# Patient Record
Sex: Male | Born: 1960 | Race: White | Hispanic: No | Marital: Married | Smoking: Never smoker
Health system: Southern US, Community
[De-identification: ages and names within clinical notes are randomized; demographics above are authoritative.]

## PROBLEM LIST (undated history)

## (undated) DIAGNOSIS — E559 Vitamin D deficiency, unspecified: Secondary | ICD-10-CM

## (undated) DIAGNOSIS — F419 Anxiety disorder, unspecified: Secondary | ICD-10-CM

## (undated) DIAGNOSIS — R002 Palpitations: Principal | ICD-10-CM

## (undated) DIAGNOSIS — I10 Essential (primary) hypertension: Secondary | ICD-10-CM

## (undated) DIAGNOSIS — K219 Gastro-esophageal reflux disease without esophagitis: Secondary | ICD-10-CM

## (undated) DIAGNOSIS — I471 Supraventricular tachycardia: Secondary | ICD-10-CM

## (undated) DIAGNOSIS — I491 Atrial premature depolarization: Secondary | ICD-10-CM

## (undated) DIAGNOSIS — L570 Actinic keratosis: Secondary | ICD-10-CM

## (undated) HISTORY — DX: Gastro-esophageal reflux disease without esophagitis: K21.9

## (undated) HISTORY — DX: Essential (primary) hypertension: I10

## (undated) HISTORY — DX: Anxiety disorder, unspecified: F41.9

## (undated) HISTORY — DX: Actinic keratosis: L57.0

## (undated) HISTORY — DX: Atrial premature depolarization: I49.1

## (undated) HISTORY — PX: KNEE SURGERY: SHX244

## (undated) HISTORY — PX: REPLANTATION THUMB: SUR1233

## (undated) HISTORY — DX: Palpitations: R00.2

## (undated) HISTORY — DX: Supraventricular tachycardia: I47.1

## (undated) HISTORY — DX: Vitamin D deficiency, unspecified: E55.9

## (undated) HISTORY — PX: HERNIA REPAIR: SHX51

---

## 2004-08-16 ENCOUNTER — Emergency Department (HOSPITAL_COMMUNITY): Admission: EM | Admit: 2004-08-16 | Discharge: 2004-08-16 | Payer: Self-pay | Admitting: Emergency Medicine

## 2005-03-25 ENCOUNTER — Ambulatory Visit (HOSPITAL_COMMUNITY): Admission: RE | Admit: 2005-03-25 | Discharge: 2005-03-25 | Payer: Self-pay | Admitting: Orthopedic Surgery

## 2005-12-03 ENCOUNTER — Emergency Department (HOSPITAL_COMMUNITY): Admission: EM | Admit: 2005-12-03 | Discharge: 2005-12-03 | Payer: Self-pay | Admitting: Emergency Medicine

## 2005-12-26 ENCOUNTER — Ambulatory Visit: Payer: Self-pay | Admitting: Internal Medicine

## 2006-02-16 DIAGNOSIS — D229 Melanocytic nevi, unspecified: Secondary | ICD-10-CM

## 2006-02-16 DIAGNOSIS — C4491 Basal cell carcinoma of skin, unspecified: Secondary | ICD-10-CM

## 2006-02-16 HISTORY — DX: Basal cell carcinoma of skin, unspecified: C44.91

## 2006-02-16 HISTORY — DX: Melanocytic nevi, unspecified: D22.9

## 2009-04-27 ENCOUNTER — Ambulatory Visit (HOSPITAL_BASED_OUTPATIENT_CLINIC_OR_DEPARTMENT_OTHER): Admission: RE | Admit: 2009-04-27 | Discharge: 2009-04-27 | Payer: Self-pay | Admitting: General Surgery

## 2010-09-29 LAB — POCT HEMOGLOBIN-HEMACUE: Hemoglobin: 16.3 g/dL (ref 13.0–17.0)

## 2016-07-19 DIAGNOSIS — H9313 Tinnitus, bilateral: Secondary | ICD-10-CM | POA: Insufficient documentation

## 2016-07-19 DIAGNOSIS — H833X9 Noise effects on inner ear, unspecified ear: Secondary | ICD-10-CM | POA: Insufficient documentation

## 2017-07-18 ENCOUNTER — Ambulatory Visit (INDEPENDENT_AMBULATORY_CARE_PROVIDER_SITE_OTHER): Payer: Self-pay | Admitting: Orthopaedic Surgery

## 2017-07-19 ENCOUNTER — Ambulatory Visit (INDEPENDENT_AMBULATORY_CARE_PROVIDER_SITE_OTHER): Payer: Medicare Other

## 2017-07-19 ENCOUNTER — Ambulatory Visit (INDEPENDENT_AMBULATORY_CARE_PROVIDER_SITE_OTHER): Payer: Medicare Other | Admitting: Physician Assistant

## 2017-07-19 ENCOUNTER — Encounter (INDEPENDENT_AMBULATORY_CARE_PROVIDER_SITE_OTHER): Payer: Self-pay | Admitting: Physician Assistant

## 2017-07-19 DIAGNOSIS — M79672 Pain in left foot: Secondary | ICD-10-CM | POA: Diagnosis not present

## 2017-07-19 MED ORDER — METHYLPREDNISOLONE 4 MG PO TABS: ORAL_TABLET | ORAL | 0 refills | Status: DC

## 2017-07-19 NOTE — Progress Notes (Deleted)
xr

## 2017-07-19 NOTE — Progress Notes (Signed)
Office Visit Note   Patient: Jason Rubio           Date of Birth: [birth year]           MRN: [id number] Visit Date: 07/19/2017              Requested by: Deland Pretty, MD [address] [place], [place] [address] PCP: Deland Pretty, MD   Assessment & Plan: Visit Diagnoses:  1. Pain in left foot     Plan:  We will place him on a Medrol Dosepak.  Seen in physical therapy for eccentric exercises of modalities to the left peroneal brevis tendon insertion.  He will follow-up with [place] in 4 weeks unless he is doing well he can cancel the appointment.  Questions were encouraged and answered at length today.  He is to take no NSAIDs while on Medrol Dosepak.  Follow-Up Instructions: Return in about 4 weeks (around 08/16/2017).   Orders:  Orders Placed This Encounter  Procedures  . XR Foot Complete Left   Meds ordered this encounter  Medications  . methylPREDNISolone (MEDROL) 4 MG tablet    Sig: Take as directed    Dispense:  21 tablet    Refill:  0      Procedures: No procedures performed   Clinical Data: No additional findings.   Subjective: Chief Complaint  Patient presents with  . Left Foot - Pain    HPI Jason Rubio is a 57 year old male comes in today for left foot pain.  States he had left hip pain 2 weeks ago went to his primary care provider at [facility] and radiographs were obtained this did not show any kind of fracture.  He was placed on meloxicam.  He did improve until yesterday he went and was standing on a step stool running for some time and his foot pain began to bother him again.  He has had no injury at all to the foot.  He denies any swelling.  Pain is mostly lateral aspect of the foot.  Pain is now a 3 out of 10 at worst.  It was 10 out of 10 2 weeks ago. Review of Systems No fevers chills shortness of breath chest pain  Objective: Vital Signs: There were no vitals taken for this visit.  Physical Exam  Constitutional: He is  oriented to person, place, and time. He appears well-developed and well-nourished. No distress.  Cardiovascular: Intact distal pulses.  Neurological: He is alert and oriented to person, place, and time.  Skin: He is not diaphoretic.  Psychiatric: He has a normal mood and affect.    Ortho Exam Bilateral feet no rashes skin lesions ulcerations erythema.  No tenderness over the posterior tibial tendons bilaterally.  Tenderness over the left peroneal brevis insertion insertion region.  Has no loss of strength with  eversion of the left foot against resistance.  He has some discomfort at the peroneus brevis insertion with extreme passive inversion of the left foot.  Nontender bilateral feet over the Achilles plantar fascia medial tubercle of the calcaneus. Specialty Comments:  No specialty comments available.  Imaging: Xr Foot Complete Left  Result Date: 07/19/2017  Left foot 3 view:  Calcaneal heel spur seen.  Otherwise no bony abnormalities: No acute fracture.  No significant arthritic changes throughout the foot.    PMFS History: There are no active problems to display for this patient.  History reviewed. No pertinent past medical history.  History reviewed. No pertinent family history.  History reviewed. No pertinent surgical history. Social History   Occupational History  . Not on file  Tobacco Use  . Smoking status: Not on file  Substance and Sexual Activity  . Alcohol use: Not on file  . Drug use: Not on file  . Sexual activity: Not on file

## 2017-08-16 ENCOUNTER — Ambulatory Visit (INDEPENDENT_AMBULATORY_CARE_PROVIDER_SITE_OTHER): Payer: Medicare Other | Admitting: Physician Assistant

## 2017-12-11 DIAGNOSIS — Z86010 Personal history of colonic polyps: Secondary | ICD-10-CM | POA: Diagnosis not present

## 2018-02-14 DIAGNOSIS — D229 Melanocytic nevi, unspecified: Secondary | ICD-10-CM | POA: Diagnosis not present

## 2018-02-14 DIAGNOSIS — L821 Other seborrheic keratosis: Secondary | ICD-10-CM | POA: Diagnosis not present

## 2018-02-14 DIAGNOSIS — L918 Other hypertrophic disorders of the skin: Secondary | ICD-10-CM | POA: Diagnosis not present

## 2018-03-05 DIAGNOSIS — Z23 Encounter for immunization: Secondary | ICD-10-CM | POA: Diagnosis not present

## 2018-04-03 DIAGNOSIS — S0096XA Insect bite (nonvenomous) of unspecified part of head, initial encounter: Secondary | ICD-10-CM | POA: Diagnosis not present

## 2018-04-03 DIAGNOSIS — W57XXXA Bitten or stung by nonvenomous insect and other nonvenomous arthropods, initial encounter: Secondary | ICD-10-CM | POA: Diagnosis not present

## 2018-04-03 DIAGNOSIS — L03221 Cellulitis of neck: Secondary | ICD-10-CM | POA: Diagnosis not present

## 2018-04-06 DIAGNOSIS — W57XXXD Bitten or stung by nonvenomous insect and other nonvenomous arthropods, subsequent encounter: Secondary | ICD-10-CM | POA: Diagnosis not present

## 2018-04-06 DIAGNOSIS — S0096XD Insect bite (nonvenomous) of unspecified part of head, subsequent encounter: Secondary | ICD-10-CM | POA: Diagnosis not present

## 2018-04-11 DIAGNOSIS — R21 Rash and other nonspecific skin eruption: Secondary | ICD-10-CM | POA: Diagnosis not present

## 2018-06-26 DIAGNOSIS — I10 Essential (primary) hypertension: Secondary | ICD-10-CM | POA: Diagnosis not present

## 2018-06-26 DIAGNOSIS — R002 Palpitations: Secondary | ICD-10-CM | POA: Diagnosis not present

## 2018-07-13 DIAGNOSIS — R002 Palpitations: Secondary | ICD-10-CM | POA: Diagnosis not present

## 2018-07-20 DIAGNOSIS — Z0000 Encounter for general adult medical examination without abnormal findings: Secondary | ICD-10-CM | POA: Diagnosis not present

## 2018-07-20 DIAGNOSIS — I10 Essential (primary) hypertension: Secondary | ICD-10-CM | POA: Diagnosis not present

## 2018-07-20 DIAGNOSIS — E559 Vitamin D deficiency, unspecified: Secondary | ICD-10-CM | POA: Diagnosis not present

## 2018-07-26 DIAGNOSIS — R002 Palpitations: Secondary | ICD-10-CM | POA: Diagnosis not present

## 2018-07-27 DIAGNOSIS — I471 Supraventricular tachycardia: Secondary | ICD-10-CM | POA: Diagnosis not present

## 2018-07-27 DIAGNOSIS — Z0000 Encounter for general adult medical examination without abnormal findings: Secondary | ICD-10-CM | POA: Diagnosis not present

## 2018-08-22 DIAGNOSIS — K644 Residual hemorrhoidal skin tags: Secondary | ICD-10-CM | POA: Diagnosis not present

## 2018-08-22 DIAGNOSIS — K602 Anal fissure, unspecified: Secondary | ICD-10-CM | POA: Diagnosis not present

## 2018-08-28 ENCOUNTER — Encounter: Payer: Self-pay | Admitting: Cardiology

## 2018-08-28 DIAGNOSIS — R002 Palpitations: Secondary | ICD-10-CM | POA: Insufficient documentation

## 2018-08-28 DIAGNOSIS — I471 Supraventricular tachycardia, unspecified: Secondary | ICD-10-CM

## 2018-08-28 DIAGNOSIS — I491 Atrial premature depolarization: Secondary | ICD-10-CM

## 2018-08-28 HISTORY — DX: Palpitations: R00.2

## 2018-08-28 HISTORY — DX: Supraventricular tachycardia: I47.1

## 2018-08-28 HISTORY — DX: Supraventricular tachycardia, unspecified: I47.10

## 2018-08-28 HISTORY — DX: Atrial premature depolarization: I49.1

## 2018-08-28 NOTE — Progress Notes (Signed)
Subjective:  Primary Physician:  Deland Pretty, MD  Patient ID: Jason Rubio, male    DOB: 1960-08-31, 58 y.o.   MRN: [id number]  Chief Complaint  Patient presents with  . Palpitations    pt wore monitor in feb 2020    HPI: Jason Rubio  is a 58 y.o. male  with hypertension, referred to [place] for evaluation of palpitations and possible SVT.  Patient reports that he has had palpitations for awhile; however, have recently gotten more frequent. He describes palpitations as a fluttering. He reports one episode last year while driving that lasted a few minutes and felt slightly light headed. No associated chest pain or syncope. Mild shortness of breath.  No history of hyperlipidemia, diabetes, or thyroid disorders. No history of sleep apnea. Denies any snoring.  He is fairly active with doing things around his house and taking care of grandchildren. No former tobacco use. He does occasionally drink alcohol. No illicit drug use. Minimal caffeine use.  Past Medical History:  Diagnosis Date  . Anxiety   . Essential hypertension 08/29/2018  . GERD (gastroesophageal reflux disease)   . PAC (premature atrial contraction) 08/28/2018  . Palpitations 08/28/2018  . SVT (supraventricular tachycardia) ([place])   . Vitamin D deficiency     Past Surgical History:  Procedure Laterality Date  . HERNIA REPAIR    . KNEE SURGERY    . REPLANTATION THUMB      Social History   Socioeconomic History  . Marital status: Married    Spouse name: Not on file  . Number of children: Not on file  . Years of education: Not on file  . Highest education level: Not on file  Occupational History  . Not on file  Social Needs  . Financial resource strain: Not on file  . Food insecurity:    Worry: Not on file    Inability: Not on file  . Transportation needs:    Medical: Not on file    Non-medical: Not on file  Tobacco Use  . Smoking status: Never Smoker  . Smokeless tobacco: Never Used  Substance and  Sexual Activity  . Alcohol use: Yes  . Drug use: Not on file  . Sexual activity: Not on file  Lifestyle  . Physical activity:    Days per week: Not on file    Minutes per session: Not on file  . Stress: Not on file  Relationships  . Social connections:    Talks on phone: Not on file    Gets together: Not on file    Attends religious service: Not on file    Active member of club or organization: Not on file    Attends meetings of clubs or organizations: Not on file    Relationship status: Not on file  . Intimate partner violence:    Fear of current or ex partner: Not on file    Emotionally abused: Not on file    Physically abused: Not on file    Forced sexual activity: Not on file  Other Topics Concern  . Not on file  Social History Narrative  . Not on file    Current Outpatient Medications on File Prior to Visit  Medication Sig Dispense Refill  . ALPRAZolam (XANAX) 0.5 MG tablet TAKE 1/2 OR 1 TABLET BY MOUTH 3 TIMES A DAY AS NEEDED  0  . losartan (COZAAR) 100 MG tablet TAKE 0.5 OF A TABLET ONCE DAILY  1  . Multiple Vitamin  tablet Take 1 tablet by mouth daily.     No current facility-administered medications on file prior to visit.      Review of Systems  Constitutional: Negative for fever, malaise/fatigue and weight loss.  Eyes: Negative for blurred vision.  Respiratory: Negative for cough and shortness of breath.   Cardiovascular: Positive for palpitations. Negative for chest pain, claudication and leg swelling.  Gastrointestinal: Negative for nausea and vomiting.  Musculoskeletal: Negative for myalgias.  Neurological: Negative for dizziness, focal weakness and headaches.  Endo/Heme/Allergies: Does not bruise/bleed easily.       Objective:  Blood pressure (!) 149/83, pulse 70, height 5' 9.25" (1.759 m), weight 204 lb (92.5 kg), SpO2 97 %. Body mass index is 29.91 kg/m.  Physical Exam  Constitutional: He is oriented to person, place, and time. Vital signs are  normal. He appears well-developed and well-nourished.  HENT:  Head: Normocephalic and atraumatic.  Neck: Normal range of motion.  Cardiovascular: Normal rate, regular rhythm, normal heart sounds and intact distal pulses.  Pulmonary/Chest: Effort normal and breath sounds normal. No accessory muscle usage. No respiratory distress.  Abdominal: Soft. Bowel sounds are normal.  Musculoskeletal: Normal range of motion.  Neurological: He is alert and oriented to person, place, and time.  Skin: Skin is warm and dry.  Vitals reviewed.   CARDIAC STUDIES:  Event monitor 2 weeks 06/26/2018: Isolated supraventricular ectopics and ventricular ectopics.  3 atrial tachycardia episodes, longest 10 beats.  Both occurred after 10:30 PM.  Assessment & Recommendations:   1. Palpitations Noted to have occasional PAC's and brief atrial tachycardia on event monitor. No SVT. Minimally symptomatic. Patient states he is not bothered by them at this point; therefore, changes were not made. 2. PACs Event monitor 2 weeks 06/26/2018: Isolated supraventricular ectopics and ventricular ectopics.  3 atrial tachycardia episodes, longest 10 beats.  Both occurred after 10:30 PM. Etiology of this was discussed. He has minimal risk factors. Do not feel that he needs further cardiac workup at this point, but encouraged him to contact me should symptoms worsen.   3. Essential hypertension Generally well controlled and monitored at home. Slightly elevated in our office. He will continue to monitor and follow up with PCP if elevated consistently.  4. Laboratory Exam: Labs 07/20/2018: CBC normal, potassium 4.8, serum glucose 87 mg, BUN 12, creatinine 1.18, CMP normal.  Total cholesterol 180, triglycerides 170, HDL 61, LDL 85.   Recommendation: As patient has minimal symptoms and minimal risk factors, do not feel further evaluation is warranted. I have reassured him. Encouraged him to contact us for any worsening symptoms.     *I have discussed this case with Dr. Einar Gip and he personally examined the patient and participated in formulating the plan.Jeri Lager, FNP-C 08/29/2018, 10:21 AM [facility]. [place] Office: [phone]

## 2018-08-29 ENCOUNTER — Encounter: Payer: Self-pay | Admitting: Cardiology

## 2018-08-29 ENCOUNTER — Ambulatory Visit: Payer: Medicare Other | Admitting: Cardiology

## 2018-08-29 DIAGNOSIS — I491 Atrial premature depolarization: Secondary | ICD-10-CM | POA: Diagnosis not present

## 2018-08-29 DIAGNOSIS — I471 Supraventricular tachycardia, unspecified: Secondary | ICD-10-CM

## 2018-08-29 DIAGNOSIS — R002 Palpitations: Secondary | ICD-10-CM | POA: Diagnosis not present

## 2018-08-29 DIAGNOSIS — I10 Essential (primary) hypertension: Secondary | ICD-10-CM | POA: Diagnosis not present

## 2018-08-29 HISTORY — DX: Essential (primary) hypertension: I10

## 2018-09-24 DIAGNOSIS — K602 Anal fissure, unspecified: Secondary | ICD-10-CM | POA: Diagnosis not present

## 2018-09-24 DIAGNOSIS — K644 Residual hemorrhoidal skin tags: Secondary | ICD-10-CM | POA: Diagnosis not present

## 2019-03-11 DIAGNOSIS — Z23 Encounter for immunization: Secondary | ICD-10-CM | POA: Diagnosis not present

## 2019-04-12 DIAGNOSIS — K648 Other hemorrhoids: Secondary | ICD-10-CM | POA: Diagnosis not present

## 2019-04-12 DIAGNOSIS — Z86010 Personal history of colonic polyps: Secondary | ICD-10-CM | POA: Diagnosis not present

## 2019-04-12 DIAGNOSIS — K644 Residual hemorrhoidal skin tags: Secondary | ICD-10-CM | POA: Diagnosis not present

## 2019-07-25 DIAGNOSIS — I10 Essential (primary) hypertension: Secondary | ICD-10-CM | POA: Diagnosis not present

## 2019-07-25 DIAGNOSIS — Z0000 Encounter for general adult medical examination without abnormal findings: Secondary | ICD-10-CM | POA: Diagnosis not present

## 2019-07-25 DIAGNOSIS — E559 Vitamin D deficiency, unspecified: Secondary | ICD-10-CM | POA: Diagnosis not present

## 2019-07-31 DIAGNOSIS — I10 Essential (primary) hypertension: Secondary | ICD-10-CM | POA: Diagnosis not present

## 2019-07-31 DIAGNOSIS — Z0000 Encounter for general adult medical examination without abnormal findings: Secondary | ICD-10-CM | POA: Diagnosis not present

## 2019-11-04 DIAGNOSIS — R002 Palpitations: Secondary | ICD-10-CM | POA: Diagnosis not present

## 2019-11-04 DIAGNOSIS — M545 Low back pain: Secondary | ICD-10-CM | POA: Diagnosis not present

## 2019-11-27 NOTE — Progress Notes (Signed)
Date:  11/28/2019   ID:  Jason Rubio, DOB 04/29/1961, MRN [id number]  PCP:  Deland Pretty, MD  Cardiologist:  Rex Kras, DO, [facility] Former Cardiology Providers: Jeri Lager, APRN, FNP-C  REQUESTING PHYSICIAN:  Deland Pretty, MD [address] [place],   [address]  Chief Complaint  Patient presents with  . Palpitations    last seen 08/27/2018    HPI  Jason Rubio is a 59 y.o. male who presents to the office with chief complaint of palpitations.  His past medical history and cardiovascular risk factors include hypertension and premature atrial contractions.  Palpitations: Patient was last seen in the office back in March 2020 for symptoms of palpitations and after undergoing an event monitor.  Since then he was doing well and recently had an episode of palpitations.  Patient states that she usually gets palpitations when he is stressed.  Recently he has been stressed due to some house work that needs to be completed and he is having a generator placed as well.  His episodes of palpitations are intermittent, can last for minutes up to an hour, not associated with symptoms of dizziness, lightheadedness, near-syncope or syncope.  But he is concerned enough to mention it to his primary care provider who referred him to [place] for further evaluation.    Based on prior records he had a cardiac event monitor for 2 weeks back in 2019 which showed isolated supraventricular and ventricular ectopic beats, 3 episodes of atrial tachycardia per report.  At the time of follow-up patient was asymptomatic and no additional cardiac work-up was recommended and no medications were started.  No former tobacco use.  He drinks approximately 3 to 4 cans of beer in one sitting at least 4 times a week, no energy drink consumption, consumes 2 cups of coffee on a daily basis, no recreational drugs use.  Denies prior history of coronary artery disease, myocardial infarction, congestive heart failure,  deep venous thrombosis, pulmonary embolism, stroke, transient ischemic attack.  FUNCTIONAL STATUS: No structured exercise program or daily routine. But maintains a home and looks after grandchildren.    ALLERGIES: Allergies  Allergen Reactions  . Latex Other (See Comments)    Skin burns  . Demerol [Meperidine] Nausea Only    MEDICATION LIST PRIOR TO VISIT: Current Meds  Medication Sig  . ALPRAZolam (XANAX) 0.5 MG tablet TAKE 1/2 OR 1 TABLET BY MOUTH 3 TIMES A DAY AS NEEDED  . cholecalciferol (VITAMIN D3) 25 MCG (1000 UNIT) tablet Take 1,000 Units by mouth daily.  Marland Kitchen esomeprazole (NEXIUM) 20 MG capsule Take 20 mg by mouth daily at 12 noon.  Marland Kitchen losartan (COZAAR) 50 MG tablet Take by mouth.  . Multiple Vitamin tablet Take 1 tablet by mouth daily.  . vitamin E 1000 UNIT capsule Take 1,000 Units by mouth daily.     PAST MEDICAL HISTORY: Past Medical History:  Diagnosis Date  . Anxiety   . Essential hypertension 08/29/2018  . GERD (gastroesophageal reflux disease)   . PAC (premature atrial contraction) 08/28/2018  . Palpitations 08/28/2018  . SVT (supraventricular tachycardia) ([place])   . Vitamin D deficiency     PAST SURGICAL HISTORY: Past Surgical History:  Procedure Laterality Date  . HERNIA REPAIR    . KNEE SURGERY    . REPLANTATION THUMB      FAMILY HISTORY: The patient family history includes Cancer in his brother; Depression in his mother; Heart defect in his brother; Heart disease in his father.  SOCIAL HISTORY:  The patient  reports that he has never smoked. He has never used smokeless tobacco. He reports current alcohol use.  REVIEW OF SYSTEMS: Review of Systems  Constitution: Negative for chills and fever.  HENT: Negative for hoarse voice and nosebleeds.   Eyes: Negative for discharge, double vision and pain.  Cardiovascular: Positive for palpitations. Negative for chest pain, claudication, dyspnea on exertion, leg swelling, near-syncope, orthopnea, paroxysmal  nocturnal dyspnea and syncope.  Respiratory: Negative for hemoptysis and shortness of breath.   Musculoskeletal: Negative for muscle cramps and myalgias.  Gastrointestinal: Negative for abdominal pain, constipation, diarrhea, hematemesis, hematochezia, melena, nausea and vomiting.  Neurological: Negative for dizziness and light-headedness.    PHYSICAL EXAM: Vitals with BMI 11/28/2019 08/29/2018  Height 5' 10"  5' 9.25"  Weight 190 lbs 204 lbs  BMI 19.50 93.26  Systolic 712 458  Diastolic 86 83  Pulse 71 70   CONSTITUTIONAL: Well-developed and well-nourished. No acute distress.  SKIN: Skin is warm and dry. No rash noted. No cyanosis. No pallor. No jaundice.  Multiple tattoos noted in bilateral upper and lower extremities. HEAD: Normocephalic and atraumatic.  EYES: No scleral icterus MOUTH/THROAT: Moist oral membranes.  NECK: No JVD present. No thyromegaly noted. No carotid bruits  LYMPHATIC: No visible cervical adenopathy.  CHEST Normal respiratory effort. No intercostal retractions  LUNGS: Clear to auscultation bilaterally.  No stridor. No wheezes. No rales.  CARDIOVASCULAR: Regular rate and rhythm, positive S1-S2, no murmurs rubs or gallops appreciated ABDOMINAL: soft, nontender, nondistended, positive bowel sounds in all no apparent ascites.  EXTREMITIES: No peripheral edema  HEMATOLOGIC: No significant bruising NEUROLOGIC: Oriented to person, place, and time. Nonfocal. Normal muscle tone.  PSYCHIATRIC: Normal mood and affect. Normal behavior. Cooperative  CARDIAC DATABASE: EKG: 11/28/2019: Normal sinus rhythm, 72 bpm, normal axis, no underlying ischemia or injury pattern.  Echocardiogram: None  Stress Testing: None   Heart Catheterization: None  Event monitor: 06/26/2018: Isolated supraventricular ectopics and ventricular ectopics.  3 atrial tachycardia episodes, longest 10 beats.  Both occurred after 10:30 PM.  LABORATORY DATA:  External Labs: Collected:  11/04/2019 Creatinine 0.99 mg/dL. EGFR:>60 mL/min per 1.73 m Lipid profile: Total cholesterol 224, triglycerides 306, HDL 61, LDL 111  07/20/2018: CBC normal, potassium 4.8, serum glucose 87 mg, BUN 12, creatinine 1.18, CMP normal.  Total cholesterol 180, triglycerides 170, HDL 61, LDL 85.  IMPRESSION:    ICD-10-CM   1. Palpitations  R00.2 EKG 12-Lead    EXTERNAL ECG MONITOR (48HRS-7DAYS) REVIEW AND INTERPRETATION    PCV ECHOCARDIOGRAM COMPLETE    PCV CARDIAC STRESS TEST    TSH  2. Essential hypertension  I10   3. PAC (premature atrial contraction)  I49.1      RECOMMENDATIONS: Jason Rubio is a 59 y.o. male whose past medical history and cardiac risk factors include: Benign essential hypertension and premature atrial contractions.  Palpitations: Patient had a cardiac event monitor back in 2019.  The event monitor as per report did not have any significant ventricular ectopic burden, no mention of atrial fibrillation or flutter, and no pauses.  Patient did have episodes of PAC and atrial tachycardia.  At that time no pharmacological therapy was initiated as patient was asymptomatic at the time of follow-up. As the symptoms do resurface episodically mostly during stressful situations pretest probability of any significant arrhythmia may be low.  However because the symptoms are recurrent reevaluation is recommended. Echocardiogram will be ordered to evaluate for structural heart disease and left ventricular systolic function. Plan  exercise treadmill stress test to evaluate for exercise-induced ischemia. Check TSH. 7-day Holter monitor to evaluate for underlying arrhythmic burden.  Benign essential hypertension: Currently managed by primary team.  Most recent blood work from his PCPs office reviewed independently.  Patient's triglyceride levels are not well controlled patient is informed that this could be secondary to high carbohydrate intake and or alcohol use.  He will follow-up  with his primary care provider for further evaluation and management.  FINAL MEDICATION LIST END OF ENCOUNTER: No orders of the defined types were placed in this encounter.    Current Outpatient Medications:  .  ALPRAZolam (XANAX) 0.5 MG tablet, TAKE 1/2 OR 1 TABLET BY MOUTH 3 TIMES A DAY AS NEEDED, Disp: , Rfl: 0 .  cholecalciferol (VITAMIN D3) 25 MCG (1000 UNIT) tablet, Take 1,000 Units by mouth daily., Disp: , Rfl:  .  esomeprazole (NEXIUM) 20 MG capsule, Take 20 mg by mouth daily at 12 noon., Disp: , Rfl:  .  losartan (COZAAR) 50 MG tablet, Take by mouth., Disp: , Rfl:  .  Multiple Vitamin tablet, Take 1 tablet by mouth daily., Disp: , Rfl:  .  vitamin E 1000 UNIT capsule, Take 1,000 Units by mouth daily., Disp: , Rfl:   Orders Placed This Encounter  Procedures  . TSH  . EXTERNAL ECG MONITOR (48HRS-7DAYS) REVIEW AND INTERPRETATION  . PCV CARDIAC STRESS TEST  . EKG 12-Lead  . PCV ECHOCARDIOGRAM COMPLETE    There are no Patient Instructions on file for this visit.   --Continue cardiac medications as reconciled in final medication list. --Return in about 6 weeks (around 01/09/2020) for review test results., re-evaluation of symptoms.. Or sooner if needed. --Continue follow-up with your primary care physician regarding the management of your other chronic comorbid conditions.  Patient's questions and concerns were addressed to his satisfaction. He voices understanding of the instructions provided during this encounter.   This note was created using a voice recognition software as a result there may be grammatical errors inadvertently enclosed that do not reflect the nature of this encounter. Every attempt is made to correct such errors.  Rex Kras, [place], [facility]  Pager: [phone] Office: [phone]

## 2019-11-28 ENCOUNTER — Other Ambulatory Visit: Payer: Self-pay

## 2019-11-28 ENCOUNTER — Encounter: Payer: Self-pay | Admitting: Cardiology

## 2019-11-28 ENCOUNTER — Ambulatory Visit: Payer: Medicare Other | Admitting: Cardiology

## 2019-11-28 VITALS — BP 138/86 | HR 71 | Ht 70.0 in | Wt 190.0 lb

## 2019-11-28 DIAGNOSIS — I10 Essential (primary) hypertension: Secondary | ICD-10-CM

## 2019-11-28 DIAGNOSIS — I491 Atrial premature depolarization: Secondary | ICD-10-CM | POA: Diagnosis not present

## 2019-11-28 DIAGNOSIS — R002 Palpitations: Secondary | ICD-10-CM

## 2019-12-02 DIAGNOSIS — R002 Palpitations: Secondary | ICD-10-CM | POA: Diagnosis not present

## 2019-12-03 LAB — TSH: TSH: 4.79 u[IU]/mL — ABNORMAL HIGH (ref 0.450–4.500)

## 2019-12-04 ENCOUNTER — Other Ambulatory Visit: Payer: Medicare Other

## 2019-12-16 ENCOUNTER — Other Ambulatory Visit: Payer: Self-pay

## 2019-12-16 ENCOUNTER — Encounter: Payer: Self-pay | Admitting: *Deleted

## 2019-12-16 ENCOUNTER — Ambulatory Visit: Payer: Medicare Other

## 2019-12-16 DIAGNOSIS — R002 Palpitations: Secondary | ICD-10-CM

## 2019-12-23 NOTE — Progress Notes (Signed)
Called patient, NA, LMAM

## 2019-12-24 NOTE — Progress Notes (Signed)
Spoke with patient voiced understanding

## 2019-12-26 DIAGNOSIS — R0789 Other chest pain: Secondary | ICD-10-CM | POA: Diagnosis not present

## 2019-12-26 DIAGNOSIS — R21 Rash and other nonspecific skin eruption: Secondary | ICD-10-CM | POA: Diagnosis not present

## 2019-12-26 DIAGNOSIS — R0781 Pleurodynia: Secondary | ICD-10-CM | POA: Diagnosis not present

## 2019-12-26 DIAGNOSIS — E038 Other specified hypothyroidism: Secondary | ICD-10-CM | POA: Diagnosis not present

## 2019-12-31 ENCOUNTER — Ambulatory Visit: Payer: Medicare Other | Admitting: Dermatology

## 2019-12-31 ENCOUNTER — Other Ambulatory Visit: Payer: Self-pay

## 2019-12-31 ENCOUNTER — Ambulatory Visit: Payer: Medicare Other

## 2019-12-31 ENCOUNTER — Other Ambulatory Visit: Payer: Self-pay | Admitting: Cardiology

## 2019-12-31 DIAGNOSIS — D225 Melanocytic nevi of trunk: Secondary | ICD-10-CM | POA: Diagnosis not present

## 2019-12-31 DIAGNOSIS — D235 Other benign neoplasm of skin of trunk: Secondary | ICD-10-CM | POA: Diagnosis not present

## 2019-12-31 DIAGNOSIS — R002 Palpitations: Secondary | ICD-10-CM

## 2019-12-31 DIAGNOSIS — D229 Melanocytic nevi, unspecified: Secondary | ICD-10-CM

## 2019-12-31 DIAGNOSIS — L570 Actinic keratosis: Secondary | ICD-10-CM | POA: Diagnosis not present

## 2019-12-31 DIAGNOSIS — Z1283 Encounter for screening for malignant neoplasm of skin: Secondary | ICD-10-CM

## 2019-12-31 DIAGNOSIS — D239 Other benign neoplasm of skin, unspecified: Secondary | ICD-10-CM

## 2019-12-31 DIAGNOSIS — I10 Essential (primary) hypertension: Secondary | ICD-10-CM | POA: Diagnosis not present

## 2019-12-31 NOTE — Patient Instructions (Signed)
Routine follow-up for Matheson Vandehei date of birth December 16, 1960.  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  General skin examination from the waist up showed no atypical moles, melanoma, or skin cancer.  On the right scapula is a 3 mm dermal papule which by history had been irritating but this is improved.  This is likely a small dermatofibroma and can be left as long as clinically stable.  On the back of his neck is a little irritated bump which is likely irritation rather than any skin growth.  If this does not go away in the next 2 to 3 months he will return for biopsy.  On his forehead and frontal scalp more than on the cheek there is minor diffuse sun damage which represents precancer.  There is nothing to biopsy or freeze today, but he will return in the late fall or winter for bluelight PDT.  This will be scheduled.  He understands that he will be extremely sun sensitive for 2days after the treatment.  He knows he can call me with any questions in the interim.

## 2020-01-01 ENCOUNTER — Encounter: Payer: Self-pay | Admitting: Dermatology

## 2020-01-01 NOTE — Progress Notes (Signed)
   Follow-Up Visit   Subjective  Jason Rubio is a 59 y.o. male who presents for the following: Skin Problem (back).  Growth Location: Right upper back Duration: Several months Quality: Was causing irritation which is improved Associated Signs/Symptoms: Modifying Factors:  Severity:  Timing: Context: History of skin cancer left upper chest  The following portions of the chart were reviewed this encounter and updated as appropriate: Tobacco  Allergies  Meds  Problems  Med Hx  Surg Hx  Fam Hx      Objective  Well appearing patient in no apparent distress; mood and affect are within normal limits.  All skin waist up examined.   Assessment & Plan  AK (actinic keratosis) (3) Mid Forehead (2); Left Forehead  Bluelight PDT in the late fall or winter  Dermatofibroma Right Upper Back  Recheck as needed change  [place] Mid Back  Annual skin examination  Routine follow-up for Jason Rubio date of birth April 01, 1961.  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  General skin examination from the waist up showed no atypical moles, melanoma, or skin cancer.  On the right scapula is a 3 mm dermal papule which by history had been irritating but this is improved.  This is likely a small dermatofibroma and can be left as long as clinically stable.  On the back of his neck is a little irritated bump which is likely irritation rather than any skin growth.  If this does not go away in the next 2 to 3 months he will return for biopsy.  On his forehead and frontal scalp more than on the cheek there is minor diffuse sun damage which represents precancer.  There is nothing to biopsy or freeze today, but he will return in the late fall or winter for bluelight PDT.  This will be scheduled.  He understands that he will be extremely sun sensitive for 2days after the  treatment.  He knows he can call me with any questions in the interim.

## 2020-01-09 ENCOUNTER — Encounter: Payer: Self-pay | Admitting: Cardiology

## 2020-01-09 ENCOUNTER — Ambulatory Visit: Payer: Medicare Other | Admitting: Cardiology

## 2020-01-09 ENCOUNTER — Other Ambulatory Visit: Payer: Self-pay

## 2020-01-09 VITALS — BP 120/71 | HR 77 | Ht 70.0 in | Wt 188.2 lb

## 2020-01-09 DIAGNOSIS — E781 Pure hyperglyceridemia: Secondary | ICD-10-CM

## 2020-01-09 DIAGNOSIS — I10 Essential (primary) hypertension: Secondary | ICD-10-CM

## 2020-01-09 DIAGNOSIS — R002 Palpitations: Secondary | ICD-10-CM | POA: Diagnosis not present

## 2020-01-09 DIAGNOSIS — Z712 Person consulting for explanation of examination or test findings: Secondary | ICD-10-CM | POA: Diagnosis not present

## 2020-01-09 DIAGNOSIS — I491 Atrial premature depolarization: Secondary | ICD-10-CM
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
## 2020-01-14 DIAGNOSIS — R002 Palpitations: Secondary | ICD-10-CM | POA: Diagnosis not present

## 2020-02-04 DIAGNOSIS — R002 Palpitations: Secondary | ICD-10-CM | POA: Diagnosis not present

## 2020-02-10 ENCOUNTER — Telehealth: Payer: Self-pay

## 2020-02-10 NOTE — Telephone Encounter (Signed)
Pt called back and informed him about his monitor pt understood.

## 2020-02-10 NOTE — Telephone Encounter (Signed)
Called pt to inform him about his monitor no ask left a vm to call back

## 2020-02-10 NOTE — Telephone Encounter (Signed)
-----   Message from [place], [place] sent at 02/09/2020  2:40 PM EDT ----- Please inform the patient that his 48-hour Holter monitor did not show any arrhythmias.  Average heart rate of 84 bpm.  If any additional questions please have him call the office and I will see him back at his yearly follow-up visit.

## 2020-03-05 DIAGNOSIS — Z23 Encounter for immunization: Secondary | ICD-10-CM | POA: Diagnosis not present

## 2020-05-04 ENCOUNTER — Ambulatory Visit (INDEPENDENT_AMBULATORY_CARE_PROVIDER_SITE_OTHER): Payer: Medicare Other | Admitting: *Deleted

## 2020-05-04 ENCOUNTER — Ambulatory Visit: Payer: Medicare Other

## 2020-05-04 ENCOUNTER — Other Ambulatory Visit: Payer: Self-pay

## 2020-05-04 DIAGNOSIS — L570 Actinic keratosis: Secondary | ICD-10-CM | POA: Diagnosis not present

## 2020-05-04 MED ORDER — AMINOLEVULINIC ACID HCL 10 % EX GEL
2000.0000 mg | Freq: Once | CUTANEOUS | Status: AC
  Administered 2020-05-04: 2000 mg via TOPICAL

## 2020-05-04 NOTE — Patient Instructions (Signed)

## 2020-05-06 NOTE — Progress Notes (Signed)
Photodynamic Therapy Procedure Note Diagnosis: Actinic keritosis Location: scalp Informed Consent: Discussed risks (burning, pain, redness, peeling, severe sunburn-like reaction, blistering, discoloration, lack of resolution) and benefits of the procedure, as well as the alternatives. Informed consent was obtained. Preparation: After cleansing the skin, the area to be treated was coated with Levulan.  This was allowed to sit on the skin for 90 minutes. Procedure Details: The patient was placed under the light source with appropriate eye protection for 16 minutes. After completing the treatment, the patient applied sunscreen to the treated areas. Patient tolerated the procedure well Plan: Avoid any sun exposure for the next 24 hours. Wear sunscreen daily for the next week. Observe normal sun precautions thereafter. Recommend OTC analgesia as needed for pain. Follow-up in 10 weeks.

## 2020-05-13 ENCOUNTER — Ambulatory Visit: Payer: Medicare Other

## 2020-07-22 ENCOUNTER — Other Ambulatory Visit: Payer: Self-pay

## 2020-07-22 ENCOUNTER — Ambulatory Visit: Payer: Medicare Other | Admitting: Dermatology

## 2020-07-22 ENCOUNTER — Encounter: Payer: Self-pay | Admitting: Dermatology

## 2020-07-22 DIAGNOSIS — L570 Actinic keratosis: Secondary | ICD-10-CM

## 2020-07-31 ENCOUNTER — Encounter: Payer: Self-pay | Admitting: Dermatology

## 2020-07-31 NOTE — Progress Notes (Signed)
   Follow-Up Visit   Subjective  Jason Rubio is a 60 y.o. male who presents for the following: Follow-up (Pdt still has some crust front scalp).  Actinic keratoses Location: Forehead and scalp Duration:  Quality: Improved associated Signs/Symptoms: Modifying Factors: PDT Severity:  Timing: Context:   Objective  Well appearing patient in no apparent distress; mood and affect are within normal limits. Objective  Head - Anterior (Face): Excellent overall 90% clearing with residual spots tiny and not currently requiring intervention    A focused examination was performed including Head and neck.. Relevant physical exam findings are noted in the Assessment and Plan.   Assessment & Plan    AK (actinic keratosis) Head - Anterior (Face)  Annual skin check, sooner if any lesions recur.      I, Lavonna Monarch, MD, have reviewed all documentation for this visit.  The documentation on 07/31/20 for the exam, diagnosis, procedures, and orders are all accurate and complete.

## 2020-09-01 DIAGNOSIS — E038 Other specified hypothyroidism: Secondary | ICD-10-CM | POA: Diagnosis not present

## 2020-09-01 DIAGNOSIS — I10 Essential (primary) hypertension: Secondary | ICD-10-CM | POA: Diagnosis not present

## 2020-09-01 DIAGNOSIS — Z0000 Encounter for general adult medical examination without abnormal findings: Secondary | ICD-10-CM | POA: Diagnosis not present

## 2020-09-01 DIAGNOSIS — E559 Vitamin D deficiency, unspecified: Secondary | ICD-10-CM | POA: Diagnosis not present

## 2020-09-08 DIAGNOSIS — M549 Dorsalgia, unspecified: Secondary | ICD-10-CM | POA: Diagnosis not present

## 2020-09-08 DIAGNOSIS — Z0000 Encounter for general adult medical examination without abnormal findings: Secondary | ICD-10-CM | POA: Diagnosis not present

## 2020-09-08 DIAGNOSIS — I10 Essential (primary) hypertension: Secondary | ICD-10-CM | POA: Diagnosis not present

## 2020-09-08 DIAGNOSIS — E559 Vitamin D deficiency, unspecified: Secondary | ICD-10-CM | POA: Diagnosis not present

## 2020-09-15 DIAGNOSIS — M542 Cervicalgia: Secondary | ICD-10-CM | POA: Diagnosis not present

## 2020-09-16 NOTE — Progress Notes (Signed)
External Labs: Collected: 09/01/2020, provided by PCP. Hemoglobin 15.1 g/dL, hematocrit 41.9% Sodium 139, potassium 5.3, chloride 101, bicarb 24 Creatinine 1.04 mg/dL. eGFR: 82 mL/min per 1.73 m AST 32, ALT 28, alkaline phosphatase 106. Lipid profile: Total cholesterol 205 triglycerides 353 HDL 69 HDL 88 TSH: 3.62  Please inform the patient that the external blood work provided by her his PCP reviewed.  At the last office visit he was recommended to improve his triglyceride levels by decreasing the carbohydrate intake and also reducing alcohol intake as well.  However, please inform the patient that his triglyceride levels continue to trend up.  At the last office visit patient mentioned that he is currently working with his PCP for the management of hypertriglyceridemia.  We will discuss it further at the next office visit.

## 2020-10-23 DIAGNOSIS — L237 Allergic contact dermatitis due to plants, except food: Secondary | ICD-10-CM | POA: Diagnosis not present

## 2020-10-29 DIAGNOSIS — M5412 Radiculopathy, cervical region: Secondary | ICD-10-CM | POA: Diagnosis not present

## 2020-11-17 DIAGNOSIS — M5412 Radiculopathy, cervical region: Secondary | ICD-10-CM | POA: Diagnosis not present

## 2020-12-23 DIAGNOSIS — R0781 Pleurodynia: Secondary | ICD-10-CM | POA: Diagnosis not present

## 2020-12-23 DIAGNOSIS — R079 Chest pain, unspecified: Secondary | ICD-10-CM | POA: Diagnosis not present

## 2020-12-23 DIAGNOSIS — K219 Gastro-esophageal reflux disease without esophagitis: Secondary | ICD-10-CM | POA: Diagnosis not present

## 2020-12-23 DIAGNOSIS — R109 Unspecified abdominal pain: Secondary | ICD-10-CM | POA: Diagnosis not present

## 2021-01-08 ENCOUNTER — Ambulatory Visit: Payer: Medicare Other | Admitting: Cardiology

## 2021-01-19 DIAGNOSIS — H10501 Unspecified blepharoconjunctivitis, right eye: Secondary | ICD-10-CM | POA: Diagnosis not present

## 2021-01-26 ENCOUNTER — Ambulatory Visit: Payer: Medicare Other | Admitting: Cardiology

## 2021-01-26 DIAGNOSIS — K219 Gastro-esophageal reflux disease without esophagitis: Secondary | ICD-10-CM | POA: Diagnosis not present

## 2021-01-26 DIAGNOSIS — R0781 Pleurodynia: Secondary | ICD-10-CM | POA: Diagnosis not present
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
## 2021-02-04 ENCOUNTER — Ambulatory Visit: Payer: Medicare Other | Admitting: Family Medicine

## 2021-02-04 ENCOUNTER — Other Ambulatory Visit: Payer: Self-pay

## 2021-02-04 ENCOUNTER — Encounter: Payer: Self-pay | Admitting: Family Medicine

## 2021-02-04 ENCOUNTER — Ambulatory Visit (INDEPENDENT_AMBULATORY_CARE_PROVIDER_SITE_OTHER): Payer: Medicare Other

## 2021-02-04 VITALS — BP 132/72 | HR 64 | Ht 70.0 in | Wt 182.0 lb

## 2021-02-04 DIAGNOSIS — R1031 Right lower quadrant pain: Secondary | ICD-10-CM

## 2021-02-04 DIAGNOSIS — Z1388 Encounter for screening for disorder due to exposure to contaminants: Secondary | ICD-10-CM | POA: Diagnosis not present

## 2021-02-04 DIAGNOSIS — M546 Pain in thoracic spine: Secondary | ICD-10-CM

## 2021-02-04 NOTE — Patient Instructions (Signed)
Thank you for coming in today.   Please get an Xray today before you leave   You should hear from MRI and Ultrasound scheduling within 1 week. If you do not hear please let me know.    Recheck after both the ultrasound and the MRI are back.

## 2021-02-05 ENCOUNTER — Telehealth: Payer: Self-pay | Admitting: Family Medicine

## 2021-02-05 DIAGNOSIS — M546 Pain in thoracic spine: Secondary | ICD-10-CM

## 2021-02-05 DIAGNOSIS — R1011 Right upper quadrant pain: Secondary | ICD-10-CM

## 2021-02-05 NOTE — Addendum Note (Signed)
Addended by: Douglass Rivers T on: 02/05/2021 01:18 PM   Modules accepted: Orders

## 2021-02-05 NOTE — Telephone Encounter (Signed)
April from [facility] called. Sounds like the US abdomen order needs to be redone.  Order notes R lower quadrant, but the exam chosen will note cover this. Also mentions R upper quadrant, which would be correct for this order.  Also need to remove diagnosis codes that are not associated with this order (pain in thoracic spine)  April [phone]

## 2021-02-05 NOTE — Telephone Encounter (Signed)
Fixed the order

## 2021-02-08 ENCOUNTER — Ambulatory Visit: Payer: Medicare Other | Admitting: Cardiology

## 2021-02-08 NOTE — Progress Notes (Signed)
Thoracic spine x-ray shows some mild arthritis changes.  No fractures are visible.

## 2021-02-08 NOTE — Progress Notes (Signed)
No metallic foreign bodies are present in the eyeball.  Safe for MRI.

## 2021-02-17 ENCOUNTER — Ambulatory Visit
Admission: RE | Admit: 2021-02-17 | Discharge: 2021-02-17 | Disposition: A | Discharge status: Home / Self Care | Payer: Medicare Other | Source: Ambulatory Visit | Attending: Family Medicine | Admitting: Family Medicine

## 2021-02-17 DIAGNOSIS — K828 Other specified diseases of gallbladder: Secondary | ICD-10-CM | POA: Diagnosis not present

## 2021-02-17 DIAGNOSIS — N281 Cyst of kidney, acquired: Secondary | ICD-10-CM | POA: Diagnosis not present

## 2021-02-17 DIAGNOSIS — R1011 Right upper quadrant pain: Secondary | ICD-10-CM

## 2021-02-18 NOTE — Progress Notes (Signed)
Abdominal ultrasound looks normal showing normal-appearing liver and gallbladder.  This is great news.  There is a small incidental cyst on the kidney that almost certainly is not causing your pain.  MRI thoracic spine is scheduled for later this week which may be helpful to evaluate cause of pain.

## 2021-02-21 ENCOUNTER — Ambulatory Visit
Admission: RE | Admit: 2021-02-21 | Discharge: 2021-02-21 | Disposition: A | Discharge status: Home / Self Care | Payer: Medicare Other | Source: Ambulatory Visit | Attending: Family Medicine | Admitting: Family Medicine

## 2021-02-21 DIAGNOSIS — M4804 Spinal stenosis, thoracic region: Secondary | ICD-10-CM | POA: Diagnosis not present

## 2021-02-21 DIAGNOSIS — M47814 Spondylosis without myelopathy or radiculopathy, thoracic region: Secondary | ICD-10-CM | POA: Diagnosis not present

## 2021-02-21 DIAGNOSIS — M5124 Other intervertebral disc displacement, thoracic region: Secondary | ICD-10-CM | POA: Diagnosis not present

## 2021-02-21 DIAGNOSIS — R1031 Right lower quadrant pain: Secondary | ICD-10-CM

## 2021-02-21 DIAGNOSIS — R222 Localized swelling, mass and lump, trunk: Secondary | ICD-10-CM | POA: Diagnosis not present

## 2021-02-21 DIAGNOSIS — M546 Pain in thoracic spine: Secondary | ICD-10-CM

## 2021-02-23 ENCOUNTER — Telehealth: Payer: Self-pay | Admitting: Family Medicine

## 2021-02-23 DIAGNOSIS — M546 Pain in thoracic spine: Secondary | ICD-10-CM

## 2021-02-23 DIAGNOSIS — M5414 Radiculopathy, thoracic region: Secondary | ICD-10-CM

## 2021-02-23 NOTE — Progress Notes (Signed)
MRI shows a bulging disc at T8-T9 that is touching the spinal cord and could potentially cause the pain that you are experiencing.  I think the next step should be an epidural steroid injection.  I have already ordered the injection.  Please call [facility] at [phone] to schedule it.  If you would like to talk about the results of the MRI and the injection please schedule follow-up appoint with me in the near future and we can discuss everything at higher details.

## 2021-02-23 NOTE — Telephone Encounter (Signed)
Epidural steroid injection ordered 

## 2021-03-04 ENCOUNTER — Ambulatory Visit
Admission: RE | Admit: 2021-03-04 | Discharge: 2021-03-04 | Disposition: A | Discharge status: Home / Self Care | Payer: Medicare Other | Source: Ambulatory Visit | Attending: Family Medicine | Admitting: Family Medicine

## 2021-03-04 DIAGNOSIS — M5414 Radiculopathy, thoracic region: Secondary | ICD-10-CM

## 2021-03-04 DIAGNOSIS — M47814 Spondylosis without myelopathy or radiculopathy, thoracic region: Secondary | ICD-10-CM | POA: Diagnosis not present

## 2021-03-04 DIAGNOSIS — M546 Pain in thoracic spine: Secondary | ICD-10-CM

## 2021-03-04 MED ORDER — TRIAMCINOLONE ACETONIDE 40 MG/ML IJ SUSP (RADIOLOGY)
60.0000 mg | Freq: Once | INTRAMUSCULAR | Status: AC
  Administered 2021-03-04: 60 mg via EPIDURAL

## 2021-03-04 MED ORDER — IOPAMIDOL (ISOVUE-M 200) INJECTION 41%
1.0000 mL | Freq: Once | INTRAMUSCULAR | Status: AC
  Administered 2021-03-04: 1 mL via EPIDURAL

## 2021-03-04 NOTE — Discharge Instructions (Signed)

## 2021-03-19 DIAGNOSIS — Z23 Encounter for immunization: Secondary | ICD-10-CM | POA: Diagnosis not present

## 2021-07-26 ENCOUNTER — Other Ambulatory Visit: Payer: Self-pay

## 2021-07-26 ENCOUNTER — Encounter: Payer: Self-pay | Admitting: Dermatology

## 2021-07-26 ENCOUNTER — Ambulatory Visit: Payer: Medicare Other | Admitting: Dermatology

## 2021-07-26 DIAGNOSIS — D2239 Melanocytic nevi of other parts of face: Secondary | ICD-10-CM

## 2021-07-26 DIAGNOSIS — Z85828 Personal history of other malignant neoplasm of skin: Secondary | ICD-10-CM

## 2021-07-26 DIAGNOSIS — Z86018 Personal history of other benign neoplasm: Secondary | ICD-10-CM | POA: Diagnosis not present

## 2021-07-26 DIAGNOSIS — D485 Neoplasm of uncertain behavior of skin: Secondary | ICD-10-CM

## 2021-07-26 DIAGNOSIS — L820 Inflamed seborrheic keratosis: Secondary | ICD-10-CM

## 2021-07-26 DIAGNOSIS — Z1283 Encounter for screening for malignant neoplasm of skin: Secondary | ICD-10-CM

## 2021-07-26 DIAGNOSIS — L570 Actinic keratosis: Secondary | ICD-10-CM | POA: Diagnosis not present

## 2021-07-26 MED ORDER — TOLAK 4 % EX CREA: 1.0000 "application " | TOPICAL_CREAM | Freq: Every evening | CUTANEOUS | 1 refills | Status: AC

## 2021-07-26 NOTE — Patient Instructions (Addendum)
CALL [facility] [phone] FOR PRESCRIPTION   CALL OFFICE IF THE REACTION FROM THE CREAM GETS TOO INTENSE   Biopsy, Surgery (Curettage) & Surgery (Excision) Aftercare Instructions  1. Okay to remove bandage in 24 hours  2. Wash area with soap and water  3. Apply Vaseline to area twice daily until healed (Not Neosporin)  4. Okay to cover with a Band-Aid to decrease the chance of infection or prevent irritation from clothing; also it's okay to uncover lesion at home.  5. Suture instructions: return to our office in 7-10 or 10-14 days for a nurse visit for suture removal. Variable healing with sutures, if pain or itching occurs call our office. It's okay to shower or bathe 24 hours after sutures are given.  6. The following risks may occur after a biopsy, curettage or excision: bleeding, scarring, discoloration, recurrence, infection (redness, yellow drainage, pain or swelling).  7. For questions, concerns and results call our office at [place] before 4pm & Friday before 3pm. Biopsy results will be available in 1 week.

## 2021-08-03 ENCOUNTER — Telehealth: Payer: Self-pay | Admitting: Dermatology

## 2021-08-03 NOTE — Telephone Encounter (Signed)
Path to patient no further work needed

## 2021-08-03 NOTE — Telephone Encounter (Signed)
Results, ST 

## 2021-08-15 ENCOUNTER — Encounter: Payer: Self-pay | Admitting: Dermatology

## 2021-08-15 NOTE — Progress Notes (Signed)
° °  Follow-Up Visit   Subjective  Jason Rubio is a 61 y.o. male who presents for the following: Annual Exam (Lesion on lip x years. Lesion on back x years, itching. Lesion on scalp x months. Personal history of bcc and atypical moles. ).  Annual skin examination, several spots of concern Location:  Duration:  Quality:  Associated Signs/Symptoms: Modifying Factors:  Severity:  Timing: Context:   Objective  Well appearing patient in no apparent distress; mood and affect are within normal limits. Scalp General skin examination, no atypical pigmented lesions.  1 possible inferonasal nonmelanoma skin cancer will be biopsied  Lower Scapula 4 mm slightly inflamed flattopped textured papule  Scalp Multiple gritty 2 to 3 mm crusts  Philtrum Pearly 4 mm papule, rule out BCC         All skin waist up examined.   Assessment & Plan    Screening exam for skin cancer Scalp  Annual skin examination.  Inflamed seborrheic keratosis Lower Scapula  No intervention necessary  Actinic keratosis Scalp  We will do [place] topical 5-fluorouracil with goal of 28 total applications.  Sun protection.  To call me if irritation gets too severe  Related Medications Fluorouracil (TOLAK) 4 % CREA Apply 1 application topically at bedtime.  Neoplasm of uncertain behavior of skin Philtrum  Skin / nail biopsy Type of biopsy: tangential   Informed consent: discussed and consent obtained   Timeout: patient name, date of birth, surgical site, and procedure verified   Anesthesia: the lesion was anesthetized in a standard fashion   Anesthetic:  1% lidocaine w/ epinephrine 1-100,000 local infiltration Instrument used: flexible razor blade   Hemostasis achieved with: ferric subsulfate and electrodesiccation   Outcome: patient tolerated procedure well   Post-procedure details: wound care instructions given    Specimen 1 - Surgical pathology Differential Diagnosis: R/O BCC VS  SCC  Check Margins: No      I, Lavonna Monarch, MD, have reviewed all documentation for this visit.  The documentation on 08/15/21 for the exam, diagnosis, procedures, and orders are all accurate and complete.

## 2021-09-07 DIAGNOSIS — E559 Vitamin D deficiency, unspecified: Secondary | ICD-10-CM | POA: Diagnosis not present

## 2021-09-07 DIAGNOSIS — I10 Essential (primary) hypertension: Secondary | ICD-10-CM | POA: Diagnosis not present

## 2021-09-07 DIAGNOSIS — Z0000 Encounter for general adult medical examination without abnormal findings: Secondary | ICD-10-CM | POA: Diagnosis not present

## 2021-09-07 DIAGNOSIS — E038 Other specified hypothyroidism: Secondary | ICD-10-CM | POA: Diagnosis not present

## 2021-09-10 DIAGNOSIS — E559 Vitamin D deficiency, unspecified: Secondary | ICD-10-CM | POA: Diagnosis not present

## 2021-09-10 DIAGNOSIS — E038 Other specified hypothyroidism: Secondary | ICD-10-CM | POA: Diagnosis not present

## 2021-09-10 DIAGNOSIS — Z0000 Encounter for general adult medical examination without abnormal findings: Secondary | ICD-10-CM | POA: Diagnosis not present

## 2021-09-10 DIAGNOSIS — I10 Essential (primary) hypertension: Secondary | ICD-10-CM | POA: Diagnosis not present

## 2022-01-28 DIAGNOSIS — R21 Rash and other nonspecific skin eruption: Secondary | ICD-10-CM | POA: Diagnosis not present

## 2022-02-24 DIAGNOSIS — M2550 Pain in unspecified joint: Secondary | ICD-10-CM | POA: Diagnosis not present

## 2022-02-24 DIAGNOSIS — R0781 Pleurodynia: Secondary | ICD-10-CM | POA: Diagnosis not present

## 2022-02-24 DIAGNOSIS — Z8261 Family history of arthritis: Secondary | ICD-10-CM | POA: Diagnosis not present

## 2022-03-04 DIAGNOSIS — R0781 Pleurodynia: Secondary | ICD-10-CM | POA: Diagnosis not present

## 2022-03-04 DIAGNOSIS — M79643 Pain in unspecified hand: Secondary | ICD-10-CM | POA: Diagnosis not present

## 2022-03-04 DIAGNOSIS — R768 Other specified abnormal immunological findings in serum: Secondary | ICD-10-CM | POA: Diagnosis not present

## 2022-03-04 DIAGNOSIS — M79642 Pain in left hand: Secondary | ICD-10-CM | POA: Diagnosis not present

## 2022-03-04 DIAGNOSIS — M1990 Unspecified osteoarthritis, unspecified site: Secondary | ICD-10-CM | POA: Diagnosis not present

## 2022-03-04 DIAGNOSIS — M79641 Pain in right hand: Secondary | ICD-10-CM | POA: Diagnosis not present

## 2022-06-07 ENCOUNTER — Other Ambulatory Visit: Payer: Self-pay | Admitting: Registered Nurse

## 2022-06-07 DIAGNOSIS — R1011 Right upper quadrant pain: Secondary | ICD-10-CM | POA: Diagnosis not present

## 2022-06-07 DIAGNOSIS — R0789 Other chest pain: Secondary | ICD-10-CM | POA: Diagnosis not present

## 2022-06-07 DIAGNOSIS — R0781 Pleurodynia: Secondary | ICD-10-CM | POA: Diagnosis not present

## 2022-06-13 ENCOUNTER — Encounter: Payer: Self-pay | Admitting: Registered Nurse

## 2022-06-15 DIAGNOSIS — R1011 Right upper quadrant pain: Secondary | ICD-10-CM | POA: Diagnosis not present

## 2022-06-15 DIAGNOSIS — K5790 Diverticulosis of intestine, part unspecified, without perforation or abscess without bleeding: Secondary | ICD-10-CM | POA: Diagnosis not present

## 2022-06-15 DIAGNOSIS — N3289 Other specified disorders of bladder: Secondary | ICD-10-CM | POA: Diagnosis not present

## 2022-06-22 DIAGNOSIS — K5730 Diverticulosis of large intestine without perforation or abscess without bleeding: Secondary | ICD-10-CM | POA: Diagnosis not present

## 2022-06-22 DIAGNOSIS — R0789 Other chest pain: Secondary | ICD-10-CM | POA: Diagnosis not present

## 2022-06-22 DIAGNOSIS — R0781 Pleurodynia: Secondary | ICD-10-CM | POA: Diagnosis not present

## 2022-06-22 DIAGNOSIS — N3289 Other specified disorders of bladder: Secondary | ICD-10-CM | POA: Diagnosis not present

## 2022-06-22 DIAGNOSIS — R911 Solitary pulmonary nodule: Secondary | ICD-10-CM | POA: Diagnosis not present

## 2022-06-29 NOTE — Progress Notes (Unsigned)
   I, Peterson Lombard, LAT, ATC acting as a scribe for Lynne Leader, MD.  Jason Rubio is a 62 y.o. male who presents to [facility] today for continued right sided T-spine/flank pain.  Patient was last seen by Dr. Georgina Snell on 02/04/2021 and an abdominal ultrasound and T-spine MRI was ordered.  Based on MRI findings and an ESI was ordered, and later performed on 03/04/2021.  Today, patient reports  Dx imaging: 02/21/2021 T-spine MRI  02/17/2021 abdominal US  02/04/2021 T-spine x-ray Chest and rib XR done at Dr. Pennie Banter office   Pertinent review of systems: ***  Relevant historical information: ***   Exam:  There were no vitals taken for this visit. General: Well Developed, well nourished, and in no acute distress.   MSK: ***    Lab and Radiology Results No results found for this or any previous visit (from the past 72 hour(s)). No results found.     Assessment and Plan: 62 y.o. male with ***   PDMP not reviewed this encounter. No orders of the defined types were placed in this encounter.  No orders of the defined types were placed in this encounter.    Discussed warning signs or symptoms. Please see discharge instructions. Patient expresses understanding.   ***

## 2022-06-30 ENCOUNTER — Ambulatory Visit: Payer: Medicare Other | Admitting: Family Medicine

## 2022-06-30 VITALS — BP 112/72 | HR 82 | Ht 70.0 in | Wt 183.0 lb

## 2022-06-30 DIAGNOSIS — M546 Pain in thoracic spine: Secondary | ICD-10-CM

## 2022-06-30 DIAGNOSIS — R0781 Pleurodynia: Secondary | ICD-10-CM

## 2022-06-30 DIAGNOSIS — M5414 Radiculopathy, thoracic region: Secondary | ICD-10-CM

## 2022-06-30 NOTE — Patient Instructions (Signed)
Thank you for coming in today.   I've referred you to Physical Therapy.  Let us know if you don't hear from them in one week.   If not better we will try Osteopathic manipulation. Let me know.   We can also ask pain doctor about repeat spine injection or even a block of the nerve to that area.

## 2022-07-05 NOTE — Therapy (Unsigned)
OUTPATIENT PHYSICAL THERAPY THORACOLUMBAR EVALUATION   Patient Name: Jason Rubio MRN: [id number] DOB:[birth year], 62 y.o., male Today's Date: 07/05/2022  END OF SESSION:   Past Medical History:  Diagnosis Date   Anxiety    Atypical mole 02/16/2006   right mild abdomen   Basal cell carcinoma 02/16/2006   left chest (curet)   Essential hypertension 08/29/2018   GERD (gastroesophageal reflux disease)    PAC (premature atrial contraction) 08/28/2018   Palpitations 08/28/2018   SVT (supraventricular tachycardia) ([place])    Vitamin D deficiency    Past Surgical History:  Procedure Laterality Date   HERNIA REPAIR     KNEE SURGERY     REPLANTATION THUMB     Patient Active Problem List   Diagnosis Date Noted   Essential hypertension 08/29/2018   SVT (supraventricular tachycardia)    Palpitations 08/28/2018   PAC (premature atrial contraction) 08/28/2018   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  ONSET DATE: 01/2021  SUBJECTIVE:                                                                                                                                                                                           SUBJECTIVE STATEMENT: ***R rib pain - no known MOI. No improvement with injection  PERTINENT HISTORY:  ***  PAIN:  Are you having pain? Yes: NPRS scale: ***/10 Pain location: *** Pain description: *** Aggravating factors: flexion Relieving factors: ***  PRECAUTIONS: {Therapy precautions:24002}  WEIGHT BEARING RESTRICTIONS: {Yes ***/No:24003}  FALLS:  Has patient fallen in last 6 months? {fallsyesno:27318}  LIVING  ENVIRONMENT: Lives with: {[facility] lives with:25569::"lives with their family"} Lives in: {Lives in:25570} Stairs: {opstairs:27293} Has following equipment at home: {Assistive devices:23999}  OCCUPATION: ***  PLOF: {PLOF:24004}  PATIENT GOALS: ***  NEXT MD VISIT:   OBJECTIVE:   DIAGNOSTIC FINDINGS:  01/2021  Tspine Xray FINDINGS: There is no evidence of thoracic spine fracture. Alignment is normal. Minimal multilevel disc space height loss and osteophytosis. No other significant bone abnormalities are identified.   IMPRESSION: Minimal multilevel disc space height loss and osteophytosis. No fracture or dislocation of the thoracic spine.  Tspine MRI FINDINGS: Alignment:  Physiologic.   Vertebrae: There is minimal degenerative endplate signal abnormality along the inferior T10 endplate and superior W54 endplate. Marrow signal is otherwise  somewhat heterogeneous throughout, without focal or suspicious signal abnormality. Vertebral body heights are preserved, without evidence of acute fracture.   Cord:  Normal signal and morphology.   Paraspinal and other soft tissues: Negative.   Disc levels:   There is mild multilevel disc desiccation and narrowing throughout the thoracic spine. There is associated mild degenerative endplate change and facet arthropathy. There are small disc protrusions at T5-T6, T6-T7, and T8-T9, most pronounced at T8-T9 where there is mild spinal canal stenosis with mild mass effect on the thoracic cord. There is no significant neural foraminal stenosis in the thoracic spine.   IMPRESSION: 1. Small disc protrusion at T8-T9 resulting in mild spinal canal stenosis with mass effect on the cord. 2. Otherwise, mild degenerative changes throughout the thoracic spine as above without other significant spinal canal or neural foraminal stenosis.  PATIENT SURVEYS:  {rehab surveys:24030}  SCREENING FOR RED FLAGS: Bowel or bladder incontinence:  {Yes/No:304960894} Spinal tumors: {Yes/No:304960894} Cauda equina syndrome: {Yes/No:304960894} Compression fracture: {Yes/No:304960894} Abdominal aneurysm: {Yes/No:304960894}  COGNITION: Overall cognitive status: {cognition:24006}     SENSATION: {sensation:27233}  MUSCLE LENGTH: Hamstrings: Right *** deg; Left *** deg Thomas test: Right *** deg; Left *** deg  POSTURE: {posture:25561}  PALPATION: ***  LUMBAR ROM:   AROM eval  Flexion   Extension   Right lateral flexion   Left lateral flexion   Right rotation   Left rotation    (Blank rows = not tested)    UE Measurements Upper Extremity Right 07/05/2022 Left 07/05/2022   A/PROM MMT A/PROM MMT  Shoulder Flexion      Shoulder Extension      Shoulder Abduction      Shoulder Adduction      Shoulder Internal Rotation      Shoulder External Rotation      Elbow Flexion      Elbow Extension      Wrist Flexion      Wrist Extension      Wrist Supination      Wrist Pronation      Wrist Ulnar Deviation      Wrist Radial Deviation      Grip Strength NA  NA     (Blank rows = not tested)   * pain   LUMBAR SPECIAL TESTS:  {lumbar special test:25242}  FUNCTIONAL TESTS:  {Functional tests:24029}  GAIT: Distance walked: *** Assistive device utilized: {Assistive devices:23999} Level of assistance: {Levels of assistance:24026} Comments: ***  TODAY'S TREATMENT:                                                                                                                              DATE: ***  07/05/2022  Therapeutic Exercise:  Aerobic: Supine: Prone:  Seated:  Standing: Neuromuscular Re-education: Manual Therapy: Therapeutic Activity: Self Care: Trigger Point Dry Needling:  Modalities:    PATIENT EDUCATION:  Education details: on current presentation, on HEP, on clinical outcomes score and POC Person educated: Patient Education method:  Explanation, Demonstration, and Handouts Education comprehension:  verbalized understanding   HOME EXERCISE PROGRAM: ***  ASSESSMENT:  CLINICAL IMPRESSION: Patient is a *** y.o. *** who was seen today for physical therapy evaluation and treatment for ***.   OBJECTIVE IMPAIRMENTS: {opptimpairments:25111}.   ACTIVITY LIMITATIONS: {activitylimitations:27494}  PARTICIPATION LIMITATIONS: {participationrestrictions:25113}  PERSONAL FACTORS: {Personal factors:25162} are also affecting patient's functional outcome.   REHAB POTENTIAL: {rehabpotential:25112}  CLINICAL DECISION MAKING: {clinical decision making:25114}  EVALUATION COMPLEXITY: {Evaluation complexity:25115}   GOALS: Goals reviewed with patient? yes  SHORT TERM GOALS: Target date: {follow up:25551}  Patient will be independent in self management strategies to improve quality of life and functional outcomes. Baseline: New Program Goal status: INITIAL  2.  Patient will report at least 50% improvement in overall symptoms and/or function to demonstrate improved functional mobility Baseline: 0% better Goal status: INITIAL  3.  *** Baseline:  Goal status: INITIAL  4.  *** Baseline:  Goal status: INITIAL    LONG TERM GOALS: Target date: {follow up:25551}   Patient will report at least 75% improvement in overall symptoms and/or function to demonstrate improved functional mobility Baseline: 0% better Goal status: INITIAL  2.  Patient will improve score on FOTO outcomes measure to projected score to demonstrate overall improved function and QOL Baseline: see above Goal status: INITIAL  3.  *** Baseline:  Goal status: INITIAL  4.  *** Baseline:  Goal status: INITIAL   PLAN:  PT FREQUENCY: {rehab frequency:25116}  PT DURATION: {rehab duration:25117}  PLANNED INTERVENTIONS: Therapeutic exercises, Therapeutic activity, Neuromuscular re-education, Balance training, Gait training, Patient/Family education, Self Care, Joint mobilization, Joint manipulation, Vestibular  training, Orthotic/Fit training, Dry Needling, Electrical stimulation, Spinal manipulation, Spinal mobilization, Cryotherapy, Moist heat, Taping, Traction, Ultrasound, Ionotophoresis '4mg'$ /ml Dexamethasone, Manual therapy, and Re-evaluation.  PLAN FOR NEXT SESSION: ***   2:43 PM, 07/05/22 Jerene Pitch, DPT Physical Therapy with [facility]

## 2022-07-06 ENCOUNTER — Ambulatory Visit: Payer: Medicare Other | Admitting: Physical Therapy

## 2022-07-06 ENCOUNTER — Encounter: Payer: Self-pay | Admitting: Physical Therapy

## 2022-07-06 DIAGNOSIS — R0781 Pleurodynia: Secondary | ICD-10-CM | POA: Diagnosis not present

## 2022-07-06 DIAGNOSIS — M6281 Muscle weakness (generalized): Secondary | ICD-10-CM

## 2022-07-06 NOTE — Patient Instructions (Signed)
Laying on your back Take a long forceful breath out as if you are blowing out 100 birthday candles -you should feel your front lower ribs come together and might feel a pull in your rib/back - set timer for 2- 5 minutes One hand on chest and one hand on your belly and try to just breath in with only your belly moving - then only your chest moving and perform 1-3 minutes with each Laying on your stomach Take a deep breath in - long breath and then hold for a couple seconds - then breath it all out - set timer for 2-5 minutes - should feel a stretch along your back

## 2022-07-18 ENCOUNTER — Other Ambulatory Visit: Payer: Medicare Other

## 2022-07-18 ENCOUNTER — Ambulatory Visit: Payer: Medicare Other | Admitting: Physical Therapy

## 2022-07-18 ENCOUNTER — Encounter: Payer: Self-pay | Admitting: Physical Therapy

## 2022-07-18 DIAGNOSIS — R0781 Pleurodynia: Secondary | ICD-10-CM | POA: Diagnosis not present

## 2022-07-18 DIAGNOSIS — M6281 Muscle weakness (generalized): Secondary | ICD-10-CM

## 2022-07-18 NOTE — Patient Instructions (Signed)
Laying on your back Take a long forceful breath out as if you are blowing out 100 birthday candles -you should feel your front lower ribs come together and might feel a pull in your rib/back - set timer- THEN try to sip in air through  a straw- so your chest doesn't rise and forceful exhale - repeat for 2- 5 minutes One hand on chest and one hand on your belly and try to just breath in with only your belly moving - then only your chest moving and perform 1-3 minutes with each Reach arms towards the ceiling as if you are separating your blades and breath in between your shoulder blades then forceful exhale out - repeat for 3-5 rounds relax - set timer for 2-5 minutes - if you feel it in your neck relax you can also look back with your neck supported - make sure to NOT lift your neck off the pillow Laying on your stomach Take a deep breath in - long breath and then hold for a couple seconds - then breath it all out - set timer for 2-5 minutes - should feel a stretch along your back

## 2022-07-18 NOTE — Therapy (Signed)
OUTPATIENT PHYSICAL THERAPY TREATMENT NOTE   Patient Name: Jason Rubio MRN: [id number] DOB:1961-06-24, 62 y.o., male Today's Date: 07/18/2022  PCP: Deland Pretty, MD   REFERRING PROVIDER: Gregor Hams, MD  END OF SESSION:   PT End of Session - 07/18/22 1018     Visit Number 2    Number of Visits 12    Date for PT Re-Evaluation 09/14/22    Authorization Type UHC no VL    PT Start Time 1018    PT Stop Time 1058    PT Time Calculation (min) 40 min    Activity Tolerance Patient tolerated treatment well    Behavior During Therapy [facility] for tasks assessed/performed             Past Medical History:  Diagnosis Date   Anxiety    Atypical mole 02/16/2006   right mild abdomen   Basal cell carcinoma 02/16/2006   left chest (curet)   Essential hypertension 08/29/2018   GERD (gastroesophageal reflux disease)    PAC (premature atrial contraction) 08/28/2018   Palpitations 08/28/2018   SVT (supraventricular tachycardia)    Vitamin D deficiency    Past Surgical History:  Procedure Laterality Date   HERNIA REPAIR     KNEE SURGERY     REPLANTATION THUMB     Patient Active Problem List   Diagnosis Date Noted   Essential hypertension 08/29/2018   SVT (supraventricular tachycardia)    Palpitations 08/28/2018   PAC (premature atrial contraction) 08/28/2018   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   Tinnitus of both ears 07/19/2016     THERAPY DIAG:  Rib pain on right side  Muscle weakness (generalized)   REFERRING DIAG:  M54.6 (ICD-10-CM) - Pain in thoracic spine M54.14 (ICD-10-CM) - Thoracic radiculopathy R07.81 (ICD-10-CM) - Rib pain on right side   Rationale for Evaluation and Treatment: Rehabilitation     ONSET DATE: 01/2021   SUBJECTIVE:                                                                                                                                                                                            SUBJECTIVE STATEMENT: 07/18/2022 States  that he feels more working the back area. States that he has been able that he has been doing exercises in all positions and with sitting.   R rib pain - no known MOI. States that pressure on the back really bothers it and comes around the front/band. Prednisone helped temporarily but then once he stopped taking it the pain returned. States that he has had injections with no benefits.  Reports sometimes he can press on the  front of his rib and it helps alleviate the pain. Pressure along the back of his rib increases his pain PERTINENT HISTORY:  knee scopes B, HTN   PAIN:  Are you having pain? Yes: NPRS scale: 4/10 Pain location: right rib Pain description: deep ache from front to back  Aggravating factors: sitting in car, certain motions like washing dishes, flexion Relieving factors: taking pressure off and prednisone   PRECAUTIONS: None   WEIGHT BEARING RESTRICTIONS: No   FALLS:  Has patient fallen in last 6 months? No     OCCUPATION: retired   PLOF: Independent   PATIENT GOALS: to have less pain   NEXT MD VISIT:    OBJECTIVE:    DIAGNOSTIC FINDINGS:  01/2021            Tspine Xray FINDINGS: There is no evidence of thoracic spine fracture. Alignment is normal. Minimal multilevel disc space height loss and osteophytosis. No other significant bone abnormalities are identified.   IMPRESSION: Minimal multilevel disc space height loss and osteophytosis. No fracture or dislocation of the thoracic spine.            Tspine MRI FINDINGS: Alignment:  Physiologic.   Vertebrae: There is minimal degenerative endplate signal abnormality along the inferior T10 endplate and superior X72 endplate. Marrow signal is otherwise somewhat heterogeneous throughout, without focal or suspicious signal abnormality. Vertebral body heights are preserved, without evidence of acute fracture.   Cord:  Normal signal and morphology.   Paraspinal and other soft tissues: Negative.   Disc  levels:   There is mild multilevel disc desiccation and narrowing throughout the thoracic spine. There is associated mild degenerative endplate change and facet arthropathy. There are small disc protrusions at T5-T6, T6-T7, and T8-T9, most pronounced at T8-T9 where there is mild spinal canal stenosis with mild mass effect on the thoracic cord. There is no significant neural foraminal stenosis in the thoracic spine.   IMPRESSION: 1. Small disc protrusion at T8-T9 resulting in mild spinal canal stenosis with mass effect on the cord. 2. Otherwise, mild degenerative changes throughout the thoracic spine as above without other significant spinal canal or neural foraminal stenosis.   PATIENT SURVEYS:  FOTO 47% function   SCREENING FOR RED FLAGS: Bowel or bladder incontinence: No Spinal tumors: No Cauda equina syndrome: No Compression fracture: No Abdominal aneurysm: No   COGNITION: Overall cognitive status: Within functional limits for tasks assessed                          SENSATION: WFL       POSTURE: rib flare > 3 inches, stiff thoracic spine   PALPATION: Tenderness along right lower ribs   LUMBAR ROM:    AROM eval  Flexion 25% limited  Extension 75% limited  Right lateral flexion 75% limited *  Left lateral flexion 75% limited *  Right rotation 50% limited   Left rotation 50% limited *   (Blank rows = not tested) *pain         FUNCTIONAL TESTS:  Breathing 25% chest 75% belly- minimal rib expansion/movement - rib movement all superior/upper chest       TODAY'S TREATMENT:  DATE:   07/18/2022    Therapeutic Exercise:    Aerobic: Supine:LTR 2 minutes, book stretch x1  3 minutes each side - breathing into sides Prone: cat cow with breathing 6 minutes    Seated:    Standing: Neuromuscular Re-education:forceful exhale with sipping  air in on exhale  8 minutes, scapular protraction with forceful exhale 8 minutes    Manual Therapy: Therapeutic Activity: Self Care: Trigger Point Dry Needling:  Modalities:      PATIENT EDUCATION:  Education details: on HEP, on rationale for interventions, prior demo for all and explanation of mechanics/goals Person educated: Patient Education method: Explanation, Demonstration, and Handouts Education comprehension: verbalized understanding     HOME EXERCISE PROGRAM: See instructions and [id number]   ASSESSMENT:   CLINICAL IMPRESSION: 07/18/2022 Overall patient is doing well and tolerated interventions well. No pain and improved control with breathing. Still primary a chest breather even with verbal and tactile cues. Added all exercises to HEP. No pain noted end of session  Eval: Patient presents with right rib/flank pain that has been present for over a year and has gradually worsened. Patient presents with significant rib flare with minimal lateral or posterior rib expansion with forceful breathing. Educated patient on current condition and answered all questions. Patient would greatly benefit from skilled PT to improve overall function and QOL.   OBJECTIVE IMPAIRMENTS: decreased activity tolerance, postural dysfunction, and pain.    ACTIVITY LIMITATIONS: lifting, bending, sitting, and reach over head   PARTICIPATION LIMITATIONS: driving and yard work   PERSONAL FACTORS: Age and Time since onset of injury/illness/exacerbation are also affecting patient's functional outcome.    REHAB POTENTIAL: Good   CLINICAL DECISION MAKING: Stable/uncomplicated   EVALUATION COMPLEXITY: Low     GOALS: Goals reviewed with patient? yes   SHORT TERM GOALS: Target date: 08/10/2022  Patient will be independent in self management strategies to improve quality of life and functional outcomes. Baseline: New Program Goal status: INITIAL   2.  Patient will report at least 50% improvement in  overall symptoms and/or function to demonstrate improved functional mobility Baseline: 0% better Goal status: INITIAL   3.  Patient will be abel to drive in any car without pain to improve ability to take care of grandchildren Baseline: unable - painful Goal status: INITIAL   4.  Patient will be able to demonstrate complete chest breath or complete belly breath to improve control over breathing mechanics Baseline: unable Goal status: INITIAL       LONG TERM GOALS: Target date: 09/14/2022    Patient will report at least 75% improvement in overall symptoms and/or function to demonstrate improved functional mobility Baseline: 0% better Goal status: INITIAL   2.  Patient will improve score on FOTO outcomes measure to projected score to demonstrate overall improved function and QOL Baseline: see above Goal status: INITIAL   3.  Patient will be able to chop wood without pain to return to PLOF Baseline: unable Goal status: INITIAL     PLAN:   PT FREQUENCY: 1-2x/week for total of 12 visits over 10 week certification   PT DURATION: 10 weeks   PLANNED INTERVENTIONS: Therapeutic exercises, Therapeutic activity, Neuromuscular re-education, Balance training, Gait training, Patient/Family education, Self Care, Joint mobilization, Joint manipulation, Vestibular training, Orthotic/Fit training, Dry Needling, Electrical stimulation, Spinal manipulation, Spinal mobilization, Cryotherapy, Moist heat, Taping, Traction, Ultrasound, Ionotophoresis '4mg'$ /ml Dexamethasone, Manual therapy, and Re-evaluation.   PLAN FOR NEXT SESSION: breathing different ways, posterior rib expansion, rotational motions/thoracic mobility  11:02 AM, 07/18/22 Jerene Pitch, DPT Physical Therapy with [facility]

## 2022-07-20 ENCOUNTER — Ambulatory Visit: Payer: Medicare Other | Admitting: Physical Therapy

## 2022-07-20 ENCOUNTER — Encounter: Payer: Self-pay | Admitting: Physical Therapy

## 2022-07-20 DIAGNOSIS — M6281 Muscle weakness (generalized): Secondary | ICD-10-CM

## 2022-07-20 DIAGNOSIS — R0781 Pleurodynia: Secondary | ICD-10-CM

## 2022-07-20 NOTE — Therapy (Signed)
OUTPATIENT PHYSICAL THERAPY TREATMENT NOTE   Patient Name: Jason Rubio MRN: [id number] DOB:[birth year], 62 y.o., male Today's Date: 07/20/2022  PCP: Deland Pretty, MD   REFERRING PROVIDER: Gregor Hams, MD  END OF SESSION:   PT End of Session - 07/20/22 1015     Visit Number 3    Number of Visits 12    Date for PT Re-Evaluation 09/14/22    Authorization Type UHC no VL    PT Start Time 1016    PT Stop Time 1056    PT Time Calculation (min) 40 min    Activity Tolerance Patient tolerated treatment well    Behavior During Therapy [facility] for tasks assessed/performed             Past Medical History:  Diagnosis Date   Anxiety    Atypical mole 02/16/2006   right mild abdomen   Basal cell carcinoma 02/16/2006   left chest (curet)   Essential hypertension 08/29/2018   GERD (gastroesophageal reflux disease)    PAC (premature atrial contraction) 08/28/2018   Palpitations 08/28/2018   SVT (supraventricular tachycardia)    Vitamin D deficiency    Past Surgical History:  Procedure Laterality Date   HERNIA REPAIR     KNEE SURGERY     REPLANTATION THUMB     Patient Active Problem List   Diagnosis Date Noted   Essential hypertension 08/29/2018   SVT (supraventricular tachycardia)    Palpitations 08/28/2018   PAC (premature atrial contraction) 08/28/2018   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   Tinnitus of both ears 07/19/2016     THERAPY DIAG:  Rib pain on right side  Muscle weakness (generalized)   REFERRING DIAG:  M54.6 (ICD-10-CM) - Pain in thoracic spine M54.14 (ICD-10-CM) - Thoracic radiculopathy R07.81 (ICD-10-CM) - Rib pain on right side   Rationale for Evaluation and Treatment: Rehabilitation     ONSET DATE: 01/2021   SUBJECTIVE:                                                                                                                                                                                            SUBJECTIVE STATEMENT: 07/20/2022 No  current pain or stiffness right now.   R rib pain - no known MOI. States that pressure on the back really bothers it and comes around the front/band. Prednisone helped temporarily but then once he stopped taking it the pain returned. States that he has had injections with no benefits.  Reports sometimes he can press on the front of his rib and it helps alleviate the pain. Pressure along the back of his rib increases his pain  PERTINENT HISTORY:  knee scopes B, HTN   PAIN:  Are you having pain? no: NPRS scale: 0/10 Pain location: right rib Pain description: deep ache from front to back  Aggravating factors: sitting in car, certain motions like washing dishes, flexion Relieving factors: taking pressure off and prednisone   PRECAUTIONS: None   WEIGHT BEARING RESTRICTIONS: No   FALLS:  Has patient fallen in last 6 months? No     OCCUPATION: retired   PLOF: Independent   PATIENT GOALS: to have less pain   NEXT MD VISIT:    OBJECTIVE:    DIAGNOSTIC FINDINGS:  01/2021            Tspine Xray FINDINGS: There is no evidence of thoracic spine fracture. Alignment is normal. Minimal multilevel disc space height loss and osteophytosis. No other significant bone abnormalities are identified.   IMPRESSION: Minimal multilevel disc space height loss and osteophytosis. No fracture or dislocation of the thoracic spine.            Tspine MRI FINDINGS: Alignment:  Physiologic.   Vertebrae: There is minimal degenerative endplate signal abnormality along the inferior T10 endplate and superior Q82 endplate. Marrow signal is otherwise somewhat heterogeneous throughout, without focal or suspicious signal abnormality. Vertebral body heights are preserved, without evidence of acute fracture.   Cord:  Normal signal and morphology.   Paraspinal and other soft tissues: Negative.   Disc levels:   There is mild multilevel disc desiccation and narrowing throughout the thoracic spine. There is  associated mild degenerative endplate change and facet arthropathy. There are small disc protrusions at T5-T6, T6-T7, and T8-T9, most pronounced at T8-T9 where there is mild spinal canal stenosis with mild mass effect on the thoracic cord. There is no significant neural foraminal stenosis in the thoracic spine.   IMPRESSION: 1. Small disc protrusion at T8-T9 resulting in mild spinal canal stenosis with mass effect on the cord. 2. Otherwise, mild degenerative changes throughout the thoracic spine as above without other significant spinal canal or neural foraminal stenosis.   PATIENT SURVEYS:  FOTO 47% function   SCREENING FOR RED FLAGS: Bowel or bladder incontinence: No Spinal tumors: No Cauda equina syndrome: No Compression fracture: No Abdominal aneurysm: No   COGNITION: Overall cognitive status: Within functional limits for tasks assessed                          SENSATION: WFL       POSTURE: rib flare > 3 inches, stiff thoracic spine   PALPATION: Tenderness along right lower ribs   LUMBAR ROM:    AROM eval  Flexion 25% limited  Extension 75% limited  Right lateral flexion 75% limited *  Left lateral flexion 75% limited *  Right rotation 50% limited   Left rotation 50% limited *   (Blank rows = not tested) *pain         FUNCTIONAL TESTS:  Breathing 25% chest 75% belly- minimal rib expansion/movement - rib movement all superior/upper chest       TODAY'S TREATMENT:  DATE:   07/20/2022  Therapeutic Exercise:    Aerobic: Supine:L book stretch x1  3 minutes each side - breathing into sides Prone:     Seated: Side lying Left- breathing with overhead reach 5 minutes, diagonal reaches and breathing 5 minutes    Standing: standing SB with Overhead reach and breathing 6 minutes, L stretch at counter L/R 6 minutes total with breathing.   Neuromuscular Re-education:forceful exhale with sipping air in on exhale  10 minutes,     Manual Therapy: Therapeutic Activity: Self Care: Trigger Point Dry Needling:  Modalities:      PATIENT EDUCATION:  Education details: on HEP  Person educated: Patient Education method: Explanation, Demonstration, and Handouts Education comprehension: verbalized understanding     HOME EXERCISE PROGRAM: See instructions and [id number]   ASSESSMENT:   CLINICAL IMPRESSION: 07/20/2022 Continued with breathing progression and rotational exercises. Tolerated well. Continues to demonstrate dominant upper chest movement vs lower rib expansion even with cues but improved in prone position. Overall tolerated session well with improved feeling of mobility end of session with no pain. Will continue with current POC as tolerated.   Eval: Patient presents with right rib/flank pain that has been present for over a year and has gradually worsened. Patient presents with significant rib flare with minimal lateral or posterior rib expansion with forceful breathing. Educated patient on current condition and answered all questions. Patient would greatly benefit from skilled PT to improve overall function and QOL.   OBJECTIVE IMPAIRMENTS: decreased activity tolerance, postural dysfunction, and pain.    ACTIVITY LIMITATIONS: lifting, bending, sitting, and reach over head   PARTICIPATION LIMITATIONS: driving and yard work   PERSONAL FACTORS: Age and Time since onset of injury/illness/exacerbation are also affecting patient's functional outcome.    REHAB POTENTIAL: Good   CLINICAL DECISION MAKING: Stable/uncomplicated   EVALUATION COMPLEXITY: Low     GOALS: Goals reviewed with patient? yes   SHORT TERM GOALS: Target date: 08/10/2022  Patient will be independent in self management strategies to improve quality of life and functional outcomes. Baseline: New Program Goal status: INITIAL   2.  Patient will  report at least 50% improvement in overall symptoms and/or function to demonstrate improved functional mobility Baseline: 0% better Goal status: INITIAL   3.  Patient will be abel to drive in any car without pain to improve ability to take care of grandchildren Baseline: unable - painful Goal status: INITIAL   4.  Patient will be able to demonstrate complete chest breath or complete belly breath to improve control over breathing mechanics Baseline: unable Goal status: INITIAL       LONG TERM GOALS: Target date: 09/14/2022    Patient will report at least 75% improvement in overall symptoms and/or function to demonstrate improved functional mobility Baseline: 0% better Goal status: INITIAL   2.  Patient will improve score on FOTO outcomes measure to projected score to demonstrate overall improved function and QOL Baseline: see above Goal status: INITIAL   3.  Patient will be able to chop wood without pain to return to PLOF Baseline: unable Goal status: INITIAL     PLAN:   PT FREQUENCY: 1-2x/week for total of 12 visits over 10 week certification   PT DURATION: 10 weeks   PLANNED INTERVENTIONS: Therapeutic exercises, Therapeutic activity, Neuromuscular re-education, Balance training, Gait training, Patient/Family education, Self Care, Joint mobilization, Joint manipulation, Vestibular training, Orthotic/Fit training, Dry Needling, Electrical stimulation, Spinal manipulation, Spinal mobilization, Cryotherapy, Moist heat, Taping, Traction, Ultrasound, Ionotophoresis '4mg'$ /ml Dexamethasone,  Manual therapy, and Re-evaluation.   PLAN FOR NEXT SESSION: breathing different ways, posterior rib expansion, rotational motions/thoracic mobility- F/U WITH scapular protractione     12:05 PM, 07/20/22 Jerene Pitch, DPT Physical Therapy with [facility]

## 2022-07-25 ENCOUNTER — Encounter: Payer: Medicare Other | Admitting: Physical Therapy

## 2022-07-26 ENCOUNTER — Ambulatory Visit: Payer: Medicare Other | Admitting: Dermatology

## 2022-07-27 ENCOUNTER — Encounter: Payer: Medicare Other | Admitting: Physical Therapy

## 2022-08-01 ENCOUNTER — Encounter: Payer: Medicare Other | Admitting: Physical Therapy

## 2022-08-03 ENCOUNTER — Encounter: Payer: Self-pay | Admitting: Physical Therapy

## 2022-08-03 ENCOUNTER — Ambulatory Visit: Payer: Medicare Other | Admitting: Physical Therapy

## 2022-08-03 DIAGNOSIS — R0781 Pleurodynia: Secondary | ICD-10-CM | POA: Diagnosis not present

## 2022-08-03 DIAGNOSIS — M6281 Muscle weakness (generalized): Secondary | ICD-10-CM | POA: Diagnosis not present

## 2022-08-03 NOTE — Therapy (Signed)
OUTPATIENT PHYSICAL THERAPY TREATMENT NOTE   Patient Name: Jason Rubio MRN: [id number] DOB:22-May-1961, 62 y.o., male Today's Date: 08/03/2022  PCP: Deland Pretty, MD   REFERRING PROVIDER: Gregor Hams, MD  END OF SESSION:   PT End of Session - 08/03/22 1019     Visit Number 4    Number of Visits 12    Date for PT Re-Evaluation 09/14/22    Authorization Type UHC no VL    PT Start Time 1019    PT Stop Time 1057    PT Time Calculation (min) 38 min    Activity Tolerance Patient tolerated treatment well    Behavior During Therapy [facility] for tasks assessed/performed             Past Medical History:  Diagnosis Date   Anxiety    Atypical mole 02/16/2006   right mild abdomen   Basal cell carcinoma 02/16/2006   left chest (curet)   Essential hypertension 08/29/2018   GERD (gastroesophageal reflux disease)    PAC (premature atrial contraction) 08/28/2018   Palpitations 08/28/2018   SVT (supraventricular tachycardia)    Vitamin D deficiency    Past Surgical History:  Procedure Laterality Date   HERNIA REPAIR     KNEE SURGERY     REPLANTATION THUMB     Patient Active Problem List   Diagnosis Date Noted   Essential hypertension 08/29/2018   SVT (supraventricular tachycardia)    Palpitations 08/28/2018   PAC (premature atrial contraction) 08/28/2018   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   Tinnitus of both ears 07/19/2016     THERAPY DIAG:  Rib pain on right side  Muscle weakness (generalized)   REFERRING DIAG:  M54.6 (ICD-10-CM) - Pain in thoracic spine M54.14 (ICD-10-CM) - Thoracic radiculopathy R07.81 (ICD-10-CM) - Rib pain on right side   Rationale for Evaluation and Treatment: Rehabilitation     ONSET DATE: 01/2021   SUBJECTIVE:                                                                                                                                                                                            SUBJECTIVE STATEMENT: 08/03/2022 States  that he is feeling better. States he hasn't had much stiffness. Reports 75% better  R rib pain - no known MOI. States that pressure on the back really bothers it and comes around the front/band. Prednisone helped temporarily but then once he stopped taking it the pain returned. States that he has had injections with no benefits.  Reports sometimes he can press on the front of his rib and it helps alleviate the pain. Pressure along the  back of his rib increases his pain PERTINENT HISTORY:  knee scopes B, HTN   PAIN:  Are you having pain? no: NPRS scale: 0/10 Pain location: right rib Pain description: deep ache from front to back  Aggravating factors: sitting in car, certain motions like washing dishes, flexion Relieving factors: taking pressure off and prednisone   PRECAUTIONS: None   WEIGHT BEARING RESTRICTIONS: No   FALLS:  Has patient fallen in last 6 months? No     OCCUPATION: retired   PLOF: Independent   PATIENT GOALS: to have less pain   NEXT MD VISIT:    OBJECTIVE:    DIAGNOSTIC FINDINGS:  01/2021            Tspine Xray FINDINGS: There is no evidence of thoracic spine fracture. Alignment is normal. Minimal multilevel disc space height loss and osteophytosis. No other significant bone abnormalities are identified.   IMPRESSION: Minimal multilevel disc space height loss and osteophytosis. No fracture or dislocation of the thoracic spine.            Tspine MRI FINDINGS: Alignment:  Physiologic.   Vertebrae: There is minimal degenerative endplate signal abnormality along the inferior T10 endplate and superior E94 endplate. Marrow signal is otherwise somewhat heterogeneous throughout, without focal or suspicious signal abnormality. Vertebral body heights are preserved, without evidence of acute fracture.   Cord:  Normal signal and morphology.   Paraspinal and other soft tissues: Negative.   Disc levels:   There is mild multilevel disc desiccation and  narrowing throughout the thoracic spine. There is associated mild degenerative endplate change and facet arthropathy. There are small disc protrusions at T5-T6, T6-T7, and T8-T9, most pronounced at T8-T9 where there is mild spinal canal stenosis with mild mass effect on the thoracic cord. There is no significant neural foraminal stenosis in the thoracic spine.   IMPRESSION: 1. Small disc protrusion at T8-T9 resulting in mild spinal canal stenosis with mass effect on the cord. 2. Otherwise, mild degenerative changes throughout the thoracic spine as above without other significant spinal canal or neural foraminal stenosis.   PATIENT SURVEYS:  FOTO 47% function   SCREENING FOR RED FLAGS: Bowel or bladder incontinence: No Spinal tumors: No Cauda equina syndrome: No Compression fracture: No Abdominal aneurysm: No   COGNITION: Overall cognitive status: Within functional limits for tasks assessed                          SENSATION: WFL       POSTURE: rib flare > 3 inches, stiff thoracic spine   PALPATION: Tenderness along right lower ribs   LUMBAR ROM:    AROM eval  Flexion 25% limited  Extension 75% limited  Right lateral flexion 75% limited *  Left lateral flexion 75% limited *  Right rotation 50% limited   Left rotation 50% limited *   (Blank rows = not tested) *pain         FUNCTIONAL TESTS:  Breathing 25% chest 75% belly- minimal rib expansion/movement - rib movement all superior/upper chest       TODAY'S TREATMENT:  DATE:   08/03/2022  Therapeutic Exercise:    Aerobic: Supine:L book stretch x1  3 minutes each side - breathing into sides, laying over ball breathing 4 minutes Prone: laying over exercise ball 2 minutes - posterior stretch.     Seated: Side lying      Standing: D1 extension black band x15 B, chops and reverse chops x15  B and each Neuromuscular Re-education:    Manual Therapy: Therapeutic Activity: Self Care: Trigger Point Dry Needling:  Modalities:      PATIENT EDUCATION:  Education details: on HEP  Person educated: Patient Education method: Explanation, Media planner, and Handouts Education comprehension: verbalized understanding     HOME EXERCISE PROGRAM: See instructions and [id number]   ASSESSMENT:   CLINICAL IMPRESSION: 08/03/2022 Focused on adding resistance exercises which were tolerated well. Reviewed HEP and checked in with goals which patient is progressing towards. Overall patient is doing well and would continue to benefit from skilled PT at this time.   Eval: Patient presents with right rib/flank pain that has been present for over a year and has gradually worsened. Patient presents with significant rib flare with minimal lateral or posterior rib expansion with forceful breathing. Educated patient on current condition and answered all questions. Patient would greatly benefit from skilled PT to improve overall function and QOL.   OBJECTIVE IMPAIRMENTS: decreased activity tolerance, postural dysfunction, and pain.    ACTIVITY LIMITATIONS: lifting, bending, sitting, and reach over head   PARTICIPATION LIMITATIONS: driving and yard work   PERSONAL FACTORS: Age and Time since onset of injury/illness/exacerbation are also affecting patient's functional outcome.    REHAB POTENTIAL: Good   CLINICAL DECISION MAKING: Stable/uncomplicated   EVALUATION COMPLEXITY: Low     GOALS: Goals reviewed with patient? yes   SHORT TERM GOALS: Target date: 08/10/2022  Patient will be independent in self management strategies to improve quality of life and functional outcomes. Baseline: New Program Goal status: MET   2.  Patient will report at least 50% improvement in overall symptoms and/or function to demonstrate improved functional mobility Baseline: 0% better Goal status: MET   3.  Patient  will be abel to drive in any car without pain to improve ability to take care of grandchildren Baseline: unable - painful Goal status: PARTIALLY MET - depends on the car   4.  Patient will be able to demonstrate complete chest breath or complete belly breath to improve control over breathing mechanics Baseline: unable Goal status: INITIAL       LONG TERM GOALS: Target date: 09/14/2022    Patient will report at least 75% improvement in overall symptoms and/or function to demonstrate improved functional mobility Baseline: 0% better Goal status: MET   2.  Patient will improve score on FOTO outcomes measure to projected score to demonstrate overall improved function and QOL Baseline: see above Goal status: INITIAL   3.  Patient will be able to chop wood without pain to return to PLOF Baseline: unable Goal status: MET     PLAN:   PT FREQUENCY: 1-2x/week for total of 12 visits over 10 week certification   PT DURATION: 10 weeks   PLANNED INTERVENTIONS: Therapeutic exercises, Therapeutic activity, Neuromuscular re-education, Balance training, Gait training, Patient/Family education, Self Care, Joint mobilization, Joint manipulation, Vestibular training, Orthotic/Fit training, Dry Needling, Electrical stimulation, Spinal manipulation, Spinal mobilization, Cryotherapy, Moist heat, Taping, Traction, Ultrasound, Ionotophoresis '4mg'$ /ml Dexamethasone, Manual therapy, and Re-evaluation.   PLAN FOR NEXT SESSION: PN next session - DC if appropriate -breathing different ways,  posterior rib expansion, rotational motions/thoracic mobility- F/U WITH scapular protraction     10:59 AM, 08/03/22 Jerene Pitch, DPT Physical Therapy with [facility]

## 2022-08-08 ENCOUNTER — Encounter: Payer: Self-pay | Admitting: Physical Therapy

## 2022-08-08 ENCOUNTER — Ambulatory Visit: Payer: Medicare Other | Admitting: Physical Therapy

## 2022-08-08 DIAGNOSIS — M6281 Muscle weakness (generalized): Secondary | ICD-10-CM

## 2022-08-08 DIAGNOSIS — R0781 Pleurodynia: Secondary | ICD-10-CM | POA: Diagnosis not present

## 2022-08-08 NOTE — Therapy (Signed)
OUTPATIENT PHYSICAL THERAPY TREATMENT NOTE PHYSICAL THERAPY DISCHARGE SUMMARY  Visits from Start of Care: 5  Current functional level related to goals / functional outcomes: All but one goal met   Remaining deficits: Continued achiness at times and difficulty sitting in low bucket seat cars    Education / Equipment: See  below  Patient agrees to discharge. Patient goals were met. Patient is being discharged due to being pleased with the current functional level.   Patient Name: Jason Rubio MRN: [id number] DOB:1960/12/25, 62 y.o., male Today's Date: 08/08/2022  PCP: Deland Pretty, MD   REFERRING PROVIDER: Gregor Hams, MD  END OF SESSION:   PT End of Session - 08/08/22 1012     Visit Number 5    Number of Visits 12    Date for PT Re-Evaluation 09/14/22    Authorization Type U[employer] no VL    PT Start Time 1020    PT Stop Time 1045 (P)     PT Time Calculation (min) 25 min (P)     Activity Tolerance Patient tolerated treatment well    Behavior During Therapy [facility] for tasks assessed/performed             Past Medical History:  Diagnosis Date   Anxiety    Atypical mole 02/16/2006   right mild abdomen   Basal cell carcinoma 02/16/2006   left chest (curet)   Essential hypertension 08/29/2018   GERD (gastroesophageal reflux disease)    PAC (premature atrial contraction) 08/28/2018   Palpitations 08/28/2018   SVT (supraventricular tachycardia)    Vitamin D deficiency    Past Surgical History:  Procedure Laterality Date   HERNIA REPAIR     KNEE SURGERY     REPLANTATION THUMB     Patient Active Problem List   Diagnosis Date Noted   Essential hypertension 08/29/2018   SVT (supraventricular tachycardia)    Palpitations 08/28/2018   PAC (premature atrial contraction) 08/28/2018   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   Tinnitus of both ears 07/19/2016     THERAPY DIAG:  Rib pain on right side  Muscle weakness (generalized)   REFERRING DIAG:  M54.6  (ICD-10-CM) - Pain in thoracic spine M54.14 (ICD-10-CM) - Thoracic radiculopathy R07.81 (ICD-10-CM) - Rib pain on right side   Rationale for Evaluation and Treatment: Rehabilitation     ONSET DATE: 01/2021   SUBJECTIVE:                                                                                                                                                                                            SUBJECTIVE STATEMENT: 08/08/2022 States that he  has some achiness with the rain.   R rib pain - no known MOI. States that pressure on the back really bothers it and comes around the front/band. Prednisone helped temporarily but then once he stopped taking it the pain returned. States that he has had injections with no benefits.  Reports sometimes he can press on the front of his rib and it helps alleviate the pain. Pressure along the back of his rib increases his pain PERTINENT HISTORY:  knee scopes B, HTN   PAIN:  Are you having pain? no: NPRS scale: 1/10 Pain location: right rib Pain description: ache from front to back  Aggravating factors: sitting in car, certain motions like washing dishes, flexion Relieving factors: taking pressure off and prednisone   PRECAUTIONS: None   WEIGHT BEARING RESTRICTIONS: No   FALLS:  Has patient fallen in last 6 months? No     OCCUPATION: retired   PLOF: Independent   PATIENT GOALS: to have less pain   NEXT MD VISIT:    OBJECTIVE:    DIAGNOSTIC FINDINGS:  01/2021            Tspine Xray FINDINGS: There is no evidence of thoracic spine fracture. Alignment is normal. Minimal multilevel disc space height loss and osteophytosis. No other significant bone abnormalities are identified.   IMPRESSION: Minimal multilevel disc space height loss and osteophytosis. No fracture or dislocation of the thoracic spine.            Tspine MRI FINDINGS: Alignment:  Physiologic.   Vertebrae: There is minimal degenerative endplate signal  abnormality along the inferior T10 endplate and superior 624THL endplate. Marrow signal is otherwise somewhat heterogeneous throughout, without focal or suspicious signal abnormality. Vertebral body heights are preserved, without evidence of acute fracture.   Cord:  Normal signal and morphology.   Paraspinal and other soft tissues: Negative.   Disc levels:   There is mild multilevel disc desiccation and narrowing throughout the thoracic spine. There is associated mild degenerative endplate change and facet arthropathy. There are small disc protrusions at T5-T6, T6-T7, and T8-T9, most pronounced at T8-T9 where there is mild spinal canal stenosis with mild mass effect on the thoracic cord. There is no significant neural foraminal stenosis in the thoracic spine.   IMPRESSION: 1. Small disc protrusion at T8-T9 resulting in mild spinal canal stenosis with mass effect on the cord. 2. Otherwise, mild degenerative changes throughout the thoracic spine as above without other significant spinal canal or neural foraminal stenosis.   PATIENT SURVEYS:  08/08/2022 FOTO 97% LUMBAR ROM:    AROM 08/08/22  Flexion 25% limited  Extension 75% limited  Right lateral flexion 75% limited *  Left lateral flexion 75% limited *  Right rotation 50% limited   Left rotation 50% limited *   (Blank rows = not tested) *pain             TODAY'S TREATMENT:  DATE:   08/08/2022  Therapeutic Exercise:    Aerobic: Supine:L book stretch x1    each side - breathing into sides, L stretch at counter 1.5 minutes, LTR 1 minute Prone: laying over exercise ball 2 minutes - posterior stretch. Laying over exercise ball 2 minutes breathing    quad cat/cow exercise 1.5 minutes Side lying      Standing: reviewed theraband exercises  Neuromuscular Re-education:    Manual Therapy: Therapeutic  Activity: Self Care: Trigger Point Dry Needling:  Modalities:      PATIENT EDUCATION:  Education details: on HEP  Person educated: Patient Education method: Explanation, Media planner, and Handouts Education comprehension: verbalized understanding     HOME EXERCISE PROGRAM: See instructions and [id number]   ASSESSMENT:   CLINICAL IMPRESSION: 08/08/2022 Reviewed entire program and answered all questions. Overall patient has met most goals and is progressing towards last goal. Patient to discharge at this time secondary to progress made and independence in HEP. Encouraged him to f/u with MD if any issues arise.   Eval: Patient presents with right rib/flank pain that has been present for over a year and has gradually worsened. Patient presents with significant rib flare with minimal lateral or posterior rib expansion with forceful breathing. Educated patient on current condition and answered all questions. Patient would greatly benefit from skilled PT to improve overall function and QOL.   OBJECTIVE IMPAIRMENTS: decreased activity tolerance, postural dysfunction, and pain.    ACTIVITY LIMITATIONS: lifting, bending, sitting, and reach over head   PARTICIPATION LIMITATIONS: driving and yard work   PERSONAL FACTORS: Age and Time since onset of injury/illness/exacerbation are also affecting patient's functional outcome.    REHAB POTENTIAL: Good   CLINICAL DECISION MAKING: Stable/uncomplicated   EVALUATION COMPLEXITY: Low     GOALS: Goals reviewed with patient? yes   SHORT TERM GOALS: Target date: 08/10/2022  Patient will be independent in self management strategies to improve quality of life and functional outcomes. Baseline: New Program Goal status: MET   2.  Patient will report at least 50% improvement in overall symptoms and/or function to demonstrate improved functional mobility Baseline: 0% better Goal status: MET   3.  Patient will be abel to drive in any car without  pain to improve ability to take care of grandchildren Baseline: unable - painful Goal status: PARTIALLY MET - depends on the car but is improving    4.  Patient will be able to demonstrate complete chest breath or complete belly breath to improve control over breathing mechanics Baseline: unable Goal status: MET       LONG TERM GOALS: Target date: 09/14/2022    Patient will report at least 75% improvement in overall symptoms and/or function to demonstrate improved functional mobility Baseline: 0% better Goal status: MET   2.  Patient will improve score on FOTO outcomes measure to projected score to demonstrate overall improved function and QOL Baseline: see above Goal status: MET   3.  Patient will be able to chop wood without pain to return to PLOF Baseline: unable Goal status: MET     PLAN:   PT FREQUENCY: 1-2x/week for total of 12 visits over 10 week certification   PT DURATION: 10 weeks   PLANNED INTERVENTIONS: Therapeutic exercises, Therapeutic activity, Neuromuscular re-education, Balance training, Gait training, Patient/Family education, Self Care, Joint mobilization, Joint manipulation, Vestibular training, Orthotic/Fit training, Dry Needling, Electrical stimulation, Spinal manipulation, Spinal mobilization, Cryotherapy, Moist heat, Taping, Traction, Ultrasound, Ionotophoresis 22m/ml Dexamethasone, Manual therapy, and Re-evaluation.   PLAN  FOR NEXT SESSION: DC to HEP     10:51 AM, 08/08/22 Jerene Pitch, DPT Physical Therapy with [facility]

## 2022-08-10 ENCOUNTER — Encounter: Payer: Medicare Other | Admitting: Physical Therapy

## 2022-08-29 ENCOUNTER — Ambulatory Visit: Payer: Medicare Other | Admitting: Dermatology

## 2022-09-07 DIAGNOSIS — Z0000 Encounter for general adult medical examination without abnormal findings: Secondary | ICD-10-CM | POA: Diagnosis not present

## 2022-09-07 DIAGNOSIS — Z8261 Family history of arthritis: Secondary | ICD-10-CM | POA: Diagnosis not present

## 2022-09-07 DIAGNOSIS — R0781 Pleurodynia: Secondary | ICD-10-CM | POA: Diagnosis not present

## 2022-09-08 DIAGNOSIS — E038 Other specified hypothyroidism: Secondary | ICD-10-CM | POA: Diagnosis not present

## 2022-09-08 DIAGNOSIS — Z8261 Family history of arthritis: Secondary | ICD-10-CM | POA: Diagnosis not present

## 2022-09-08 DIAGNOSIS — Z0000 Encounter for general adult medical examination without abnormal findings: Secondary | ICD-10-CM | POA: Diagnosis not present

## 2022-09-08 DIAGNOSIS — E559 Vitamin D deficiency, unspecified: Secondary | ICD-10-CM | POA: Diagnosis not present

## 2022-09-08 DIAGNOSIS — R0781 Pleurodynia: Secondary | ICD-10-CM | POA: Diagnosis not present

## 2022-09-14 DIAGNOSIS — E038 Other specified hypothyroidism: Secondary | ICD-10-CM | POA: Diagnosis not present

## 2022-09-14 DIAGNOSIS — Z0000 Encounter for general adult medical examination without abnormal findings: Secondary | ICD-10-CM | POA: Diagnosis not present

## 2022-09-14 DIAGNOSIS — E559 Vitamin D deficiency, unspecified: Secondary | ICD-10-CM | POA: Diagnosis not present

## 2022-09-14 DIAGNOSIS — I10 Essential (primary) hypertension: Secondary | ICD-10-CM | POA: Diagnosis not present

## 2022-09-14 DIAGNOSIS — K219 Gastro-esophageal reflux disease without esophagitis: Secondary | ICD-10-CM | POA: Diagnosis not present

## 2022-09-14 DIAGNOSIS — R748 Abnormal levels of other serum enzymes: Secondary | ICD-10-CM | POA: Diagnosis not present

## 2022-10-05 ENCOUNTER — Ambulatory Visit: Payer: Medicare Other | Admitting: Dermatology

## 2022-10-05 VITALS — BP 123/76 | HR 63

## 2022-10-05 DIAGNOSIS — L821 Other seborrheic keratosis: Secondary | ICD-10-CM

## 2022-10-05 DIAGNOSIS — L578 Other skin changes due to chronic exposure to nonionizing radiation: Secondary | ICD-10-CM

## 2022-10-05 DIAGNOSIS — L814 Other melanin hyperpigmentation: Secondary | ICD-10-CM | POA: Diagnosis not present

## 2022-10-05 DIAGNOSIS — Z808 Family history of malignant neoplasm of other organs or systems: Secondary | ICD-10-CM

## 2022-10-05 DIAGNOSIS — Z1283 Encounter for screening for malignant neoplasm of skin: Secondary | ICD-10-CM | POA: Diagnosis not present

## 2022-10-05 DIAGNOSIS — D1801 Hemangioma of skin and subcutaneous tissue: Secondary | ICD-10-CM

## 2022-10-05 DIAGNOSIS — D229 Melanocytic nevi, unspecified: Secondary | ICD-10-CM | POA: Diagnosis not present

## 2022-10-05 NOTE — Progress Notes (Signed)
   New Pt Visit   Subjective  Jason Rubio is a 62 y.o. male who presents for the following: Skin Cancer Screening and Full Body Skin Exam New patient here to establish care. Reports family history of melanoma in his mother's side of family and reports a brother had melanoma The patient presents for Total-Body Skin Exam (TBSE) for skin cancer screening and mole check. The patient has spots, moles and lesions to be evaluated, some may be new or changing and the patient has concerns that these could be cancer.  The following portions of the chart were reviewed this encounter and updated as appropriate: medications, allergies, medical history  Review of Systems:  No other skin or systemic complaints except as noted in HPI or Assessment and Plan.  Objective  Well appearing patient in no apparent distress; mood and affect are within normal limits.  A full examination was performed including scalp, head, eyes, ears, nose, lips, neck, chest, axillae, abdomen, back, buttocks, bilateral upper extremities, bilateral lower extremities, hands, feet, fingers, toes, fingernails, and toenails. All findings within normal limits unless otherwise noted below.   Relevant physical exam findings are noted in the Assessment and Plan.       Assessment & Plan    LENTIGINES, SEBORRHEIC KERATOSES, HEMANGIOMAS - Benign normal skin lesions - Benign-appearing - Call for any changes  MELANOCYTIC NEVI - Tan-brown and/or pink-flesh-colored symmetric macules and papules - Benign appearing on exam today - Observation - Call clinic for new or changing moles - Recommend daily use of broad spectrum spf 30+ sunscreen to sun-exposed areas.  Multiple nevi at chest see photos  ACTINIC DAMAGE - Chronic condition, secondary to cumulative UV/sun exposure - diffuse scaly erythematous macules with underlying dyspigmentation - Recommend daily broad spectrum sunscreen SPF 30+ to sun-exposed areas, reapply every 2 hours  as needed.  - Staying in the shade or wearing long sleeves, sun glasses (UVA+UVB protection) and wide brim hats (4-inch brim around the entire circumference of the hat) are also recommended for sun protection.  - Call for new or changing lesions.  FAMILY HISTORY OF SKIN CANCER What type(s):melanoma  Who affected:in maternal uncles and brother   SKIN CANCER SCREENING PERFORMED TODAY.   Return for 1 - 2 year tbse .  IAsher Muir, CMA, am acting as scribe for Armida Sans, MD.  Documentation: I have reviewed the above documentation for accuracy and completeness, and I agree with the above.  Armida Sans, MD

## 2022-10-05 NOTE — Patient Instructions (Addendum)
     Melanoma ABCDEs  Melanoma is the most dangerous type of skin cancer, and is the leading cause of death from skin disease.  You are more likely to develop melanoma if you: Have light-colored skin, light-colored eyes, or red or blond hair Spend a lot of time in the sun Tan regularly, either outdoors or in a tanning bed Have had blistering sunburns, especially during childhood Have a close family member who has had a melanoma Have atypical moles or large birthmarks  Early detection of melanoma is key since treatment is typically straightforward and cure rates are extremely high if we catch it early.   The first sign of melanoma is often a change in a mole or a new dark spot.  The ABCDE system is a way of remembering the signs of melanoma.  A for asymmetry:  The two halves do not match. B for border:  The edges of the growth are irregular. C for color:  A mixture of colors are present instead of an even brown color. D for diameter:  Melanomas are usually (but not always) greater than 6mm - the size of a pencil eraser. E for evolution:  The spot keeps changing in size, shape, and color.  Please check your skin once per month between visits. You can use a small mirror in front and a large mirror behind you to keep an eye on the back side or your body.   If you see any new or changing lesions before your next follow-up, please call to schedule a visit.  Please continue daily skin protection including broad spectrum sunscreen SPF 30+ to sun-exposed areas, reapplying every 2 hours as needed when you're outdoors.   Staying in the shade or wearing long sleeves, sun glasses (UVA+UVB protection) and wide brim hats (4-inch brim around the entire circumference of the hat) are also recommended for sun protection.    Due to recent changes in healthcare laws, you may see results of your pathology and/or laboratory studies on MyChart before the doctors have had a chance to review them. We  understand that in some cases there may be results that are confusing or concerning to you. Please understand that not all results are received at the same time and often the doctors may need to interpret multiple results in order to provide you with the best plan of care or course of treatment. Therefore, we ask that you please give us 2 business days to thoroughly review all your results before contacting the office for clarification. Should we see a critical lab result, you will be contacted sooner.   If You Need Anything After Your Visit  If you have any questions or concerns for your doctor, please call our main line at [phone] and press option 4 to reach your doctor's medical assistant. If no one answers, please leave a voicemail as directed and we will return your call as soon as possible. Messages left after 4 pm will be answered the following business day.   You may also send us a message via MyChart. We typically respond to MyChart messages within 1-2 business days.  For prescription refills, please ask your pharmacy to contact our office. Our fax number is [phone].  If you have an urgent issue when the clinic is closed that cannot wait until the next business day, you can page your doctor at the number below.    Please note that while we do our best to be available for urgent issues   outside of office hours, we are not available 24/7.   If you have an urgent issue and are unable to reach us, you may choose to seek medical care at your doctor's office, retail clinic, urgent care center, or emergency room.  If you have a medical emergency, please immediately call 911 or go to the emergency department.  Pager Numbers  - Dr. Kowalski: [phone]  - Dr. Moye: [phone]  - Dr. Stewart: [phone]  In the event of inclement weather, please call our main line at [phone] for an update on the status of any delays or closures.  Dermatology Medication Tips: Please  keep the boxes that topical medications come in in order to help keep track of the instructions about where and how to use these. Pharmacies typically print the medication instructions only on the boxes and not directly on the medication tubes.   If your medication is too expensive, please contact our office at [phone] option 4 or send us a message through MyChart.   We are unable to tell what your co-pay for medications will be in advance as this is different depending on your insurance coverage. However, we may be able to find a substitute medication at lower cost or fill out paperwork to get insurance to cover a needed medication.   If a prior authorization is required to get your medication covered by your insurance company, please allow us 1-2 business days to complete this process.  Drug prices often vary depending on where the prescription is filled and some pharmacies may offer cheaper prices.  The website www.goodrx.com contains coupons for medications through different pharmacies. The prices here do not account for what the cost may be with help from insurance (it may be cheaper with your insurance), but the website can give you the price if you did not use any insurance.  - You can print the associated coupon and take it with your prescription to the pharmacy.  - You may also stop by our office during regular business hours and pick up a GoodRx coupon card.  - If you need your prescription sent electronically to a different pharmacy, notify our office through [place] MyChart or by phone at [phone] option 4.     Si Usted Necesita Algo Despus de Su Visita  Tambin puede enviarnos un mensaje a travs de MyChart. Por lo general respondemos a los mensajes de MyChart en el transcurso de 1 a 2 das hbiles.  Para renovar recetas, por favor pida a su farmacia que se ponga en contacto con nuestra oficina. Nuestro nmero de fax es el [phone].  Si tiene un asunto urgente  cuando la clnica est cerrada y que no puede esperar hasta el siguiente da hbil, puede llamar/localizar a su doctor(a) al nmero que aparece a continuacin.   Por favor, tenga en cuenta que aunque hacemos todo lo posible para estar disponibles para asuntos urgentes fuera del horario de oficina, no estamos disponibles las 24 horas del da, los 7 das de la semana.   Si tiene un problema urgente y no puede comunicarse con nosotros, puede optar por buscar atencin mdica  en el consultorio de su doctor(a), en una clnica privada, en un centro de atencin urgente o en una sala de emergencias.  Si tiene una emergencia mdica, por favor llame inmediatamente al 911 o vaya a la sala de emergencias.  Nmeros de bper  - Dr. Kowalski: [phone]  - Dra. Moye: [phone]  - Dra. Stewart: [phone]  En caso   de inclemencias del tiempo, por favor llame a nuestra lnea principal al [phone] para una actualizacin sobre el estado de cualquier retraso o cierre.  Consejos para la medicacin en dermatologa: Por favor, guarde las cajas en las que vienen los medicamentos de uso tpico para ayudarle a seguir las instrucciones sobre dnde y cmo usarlos. Las farmacias generalmente imprimen las instrucciones del medicamento slo en las cajas y no directamente en los tubos del medicamento.   Si su medicamento es muy caro, por favor, pngase en contacto con nuestra oficina llamando al [phone] y presione la opcin 4 o envenos un mensaje a travs de MyChart.   No podemos decirle cul ser su copago por los medicamentos por adelantado ya que esto es diferente dependiendo de la cobertura de su seguro. Sin embargo, es posible que podamos encontrar un medicamento sustituto a menor costo o llenar un formulario para que el seguro cubra el medicamento que se considera necesario.   Si se requiere una autorizacin previa para que su compaa de seguros cubra su medicamento, por favor permtanos de 1 a 2  das hbiles para completar este proceso.  Los precios de los medicamentos varan con frecuencia dependiendo del lugar de dnde se surte la receta y alguna farmacias pueden ofrecer precios ms baratos.  El sitio web www.goodrx.com tiene cupones para medicamentos de diferentes farmacias. Los precios aqu no tienen en cuenta lo que podra costar con la ayuda del seguro (puede ser ms barato con su seguro), pero el sitio web puede darle el precio si no utiliz ningn seguro.  - Puede imprimir el cupn correspondiente y llevarlo con su receta a la farmacia.  - Tambin puede pasar por nuestra oficina durante el horario de atencin regular y recoger una tarjeta de cupones de GoodRx.  - Si necesita que su receta se enve electrnicamente a una farmacia diferente, informe a nuestra oficina a travs de MyChart de [place] o por telfono llamando al [phone] y presione la opcin 4.  

## 2022-10-21 ENCOUNTER — Encounter: Payer: Self-pay | Admitting: Dermatology

## 2023-01-13 DIAGNOSIS — H903 Sensorineural hearing loss, bilateral: Secondary | ICD-10-CM | POA: Diagnosis not present

## 2023-01-13 DIAGNOSIS — H9313 Tinnitus, bilateral: Secondary | ICD-10-CM | POA: Diagnosis not present

## 2023-03-02 ENCOUNTER — Other Ambulatory Visit: Payer: Self-pay

## 2023-03-02 ENCOUNTER — Emergency Department (HOSPITAL_COMMUNITY)
Admission: EM | Admit: 2023-03-02 | Discharge: 2023-03-02 | Disposition: A | Discharge status: Home / Self Care | Payer: Medicare Other | Attending: Student | Admitting: Student

## 2023-03-02 ENCOUNTER — Emergency Department (HOSPITAL_COMMUNITY): Payer: Medicare Other

## 2023-03-02 ENCOUNTER — Encounter (HOSPITAL_COMMUNITY): Payer: Self-pay

## 2023-03-02 DIAGNOSIS — I6523 Occlusion and stenosis of bilateral carotid arteries: Secondary | ICD-10-CM | POA: Diagnosis not present

## 2023-03-02 DIAGNOSIS — H9312 Tinnitus, left ear: Secondary | ICD-10-CM | POA: Diagnosis not present

## 2023-03-02 DIAGNOSIS — R110 Nausea: Secondary | ICD-10-CM | POA: Diagnosis not present

## 2023-03-02 DIAGNOSIS — I10 Essential (primary) hypertension: Secondary | ICD-10-CM | POA: Diagnosis not present

## 2023-03-02 DIAGNOSIS — R42 Dizziness and giddiness: Secondary | ICD-10-CM | POA: Diagnosis not present

## 2023-03-02 DIAGNOSIS — Z79899 Other long term (current) drug therapy: Secondary | ICD-10-CM | POA: Diagnosis not present

## 2023-03-02 DIAGNOSIS — I672 Cerebral atherosclerosis: Secondary | ICD-10-CM | POA: Diagnosis not present

## 2023-03-02 DIAGNOSIS — Z743 Need for continuous supervision: Secondary | ICD-10-CM | POA: Diagnosis not present

## 2023-03-02 DIAGNOSIS — R404 Transient alteration of awareness: Secondary | ICD-10-CM | POA: Diagnosis not present

## 2023-03-02 DIAGNOSIS — R0902 Hypoxemia: Secondary | ICD-10-CM | POA: Diagnosis not present

## 2023-03-02 LAB — COMPREHENSIVE METABOLIC PANEL
ALT: 25 U/L (ref 0–44)
AST: 34 U/L (ref 15–41)
Albumin: 3.7 g/dL (ref 3.5–5.0)
Alkaline Phosphatase: 62 U/L (ref 38–126)
Anion gap: 11 (ref 5–15)
BUN: 10 mg/dL (ref 8–23)
CO2: 21 mmol/L — ABNORMAL LOW (ref 22–32)
Calcium: 9 mg/dL (ref 8.9–10.3)
Chloride: 107 mmol/L (ref 98–111)
Creatinine, Ser: 1.07 mg/dL (ref 0.61–1.24)
GFR, Estimated: >60 mL/min (ref >60)
Glucose, Bld: 123 mg/dL — ABNORMAL HIGH (ref 70–99)
Potassium: 3.4 mmol/L — ABNORMAL LOW (ref 3.5–5.1)
Sodium: 139 mmol/L (ref 135–145)
Total Bilirubin: 0.5 mg/dL (ref 0.3–1.2)
Total Protein: 6.9 g/dL (ref 6.5–8.1)

## 2023-03-02 LAB — CBC WITH DIFFERENTIAL/PLATELET
Abs Immature Granulocytes: 0.03 10*3/uL (ref 0.00–0.07)
Basophils Absolute: 0 10*3/uL (ref 0.0–0.1)
Basophils Relative: 0 %
Eosinophils Absolute: 0.1 10*3/uL (ref 0.0–0.5)
Eosinophils Relative: 1 %
HCT: 36.9 % — ABNORMAL LOW (ref 39.0–52.0)
Hemoglobin: 13.5 g/dL (ref 13.0–17.0)
Immature Granulocytes: 0 %
Lymphocytes Relative: 11 %
Lymphs Abs: 0.9 10*3/uL (ref 0.7–4.0)
MCH: 34.4 pg — ABNORMAL HIGH (ref 26.0–34.0)
MCHC: 36.6 g/dL — ABNORMAL HIGH (ref 30.0–36.0)
MCV: 93.9 fL (ref 80.0–100.0)
Monocytes Absolute: 0.5 10*3/uL (ref 0.1–1.0)
Monocytes Relative: 7 %
Neutro Abs: 6.1 10*3/uL (ref 1.7–7.7)
Neutrophils Relative %: 81 %
Platelets: 169 10*3/uL (ref 150–400)
RBC: 3.93 MIL/uL — ABNORMAL LOW (ref 4.22–5.81)
RDW: 11.9 % (ref 11.5–15.5)
WBC: 7.6 10*3/uL (ref 4.0–10.5)
nRBC: 0 % (ref 0.0–0.2)

## 2023-03-02 MED ORDER — MECLIZINE HCL 25 MG PO TABS: 25.0000 mg | ORAL_TABLET | Freq: Three times a day (TID) | ORAL | 0 refills | Status: AC | PRN

## 2023-03-02 MED ORDER — LACTATED RINGERS IV BOLUS
1000.0000 mL | Freq: Once | INTRAVENOUS | Status: AC
  Administered 2023-03-02: 1000 mL via INTRAVENOUS

## 2023-03-02 MED ORDER — DIPHENHYDRAMINE HCL 50 MG/ML IJ SOLN
25.0000 mg | Freq: Once | INTRAMUSCULAR | Status: AC
  Administered 2023-03-02: 25 mg via INTRAVENOUS
  Filled 2023-03-02: qty 1

## 2023-03-02 MED ORDER — PROCHLORPERAZINE EDISYLATE 10 MG/2ML IJ SOLN
10.0000 mg | Freq: Once | INTRAMUSCULAR | Status: AC
  Administered 2023-03-02: 10 mg via INTRAVENOUS
  Filled 2023-03-02: qty 2

## 2023-03-02 MED ORDER — IOHEXOL 350 MG/ML SOLN
75.0000 mL | Freq: Once | INTRAVENOUS | Status: AC | PRN
  Administered 2023-03-02: 75 mL via INTRAVENOUS

## 2023-03-02 NOTE — ED Triage Notes (Signed)
PT BIB EMS for dizziness and nausea, pt has had ringing in the left ear for about a year, worsening symptoms this morning.  Feels like the room is spinning.   EKG unremarkable 138/90 HR 56 O2 98 CBG 129 18 g L AC 4 mg Zofran, IV

## 2023-03-02 NOTE — ED Provider Notes (Signed)
[place] EMERGENCY DEPARTMENT AT [facility] Provider Note  CSN: [id number] Arrival date & time: 03/02/23 4235  Chief Complaint(s) Dizziness (Nausea)  HPI KATLIN SON is a 62 y.o. male with PMH anxiety, GERD, chronic tinnitus who presents emergency room for evaluation of dizziness and nausea.  Patient states that he awoke this morning abruptly at 530 with sudden onset vertigo.  States that he feels like the room is spinning worse with head movement.  Is fatigable when keeping his head still.  Denies associated numbness, tingling, weakness, visual deficits or other systemic or neurologic complaints.  No previous history of vertigo.   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 Authorizing Provider  ALPRAZolam (XANAX) 0.5 MG tablet TAKE 1/2 OR 1 TABLET BY MOUTH 3 TIMES A DAY AS NEEDED 05/23/17   [provider]  Ascorbic Acid (VITAMIN C) 500 MG CAPS See admin instructions.    [provider]  Cholecalciferol (VITAMIN D) 50 MCG (2000 UT) tablet 1 tablet    [provider]  Fluorouracil (TOLAK) 4 % CREA Apply 1 application topically at bedtime. 07/26/21   Janalyn Harder, MD  losartan (COZAAR) 100 MG tablet Take 50 mg by mouth daily. 01/07/20    [provider]  Multiple Vitamin tablet Take 1 tablet by mouth daily.    [provider]  vitamin E 1000 UNIT capsule Take 1,000 Units by mouth daily.    [provider]                                                                                                                                    Past Surgical History Past Surgical History:  Procedure Laterality Date   HERNIA REPAIR     KNEE SURGERY     REPLANTATION THUMB     Family History Family History  Problem Relation Age of Onset   Depression Mother    Heart disease Father    Cancer Brother    Heart defect Brother     Social History Social History   Tobacco Use   Smoking status: Never   Smokeless tobacco: Never  Vaping Use   Vaping status: Never Used  Substance Use Topics  Alcohol use: Yes   Drug use: Never   Allergies Latex and Demerol [meperidine]  Review of Systems Review of Systems  Gastrointestinal:  Positive for nausea.  Neurological:  Positive for dizziness.    Physical Exam Vital Signs  I have reviewed the triage vital signs BP 135/74   Pulse (!) 53   Temp 97.7 F (36.5 C) (Oral)   Resp 17   Ht 5\' 10"  (1.778 m)   Wt 83 kg   SpO2 100%   BMI 26.26 kg/m   Physical Exam Vitals and nursing note reviewed.  Constitutional:      General: He is not in acute distress.    Appearance: He is well-developed.  HENT:     Head: Normocephalic and atraumatic.  Eyes:     Conjunctiva/sclera: Conjunctivae normal.  Cardiovascular:     Rate and Rhythm: Normal rate and regular rhythm.     Heart sounds: No murmur heard. Pulmonary:     Effort: Pulmonary effort is normal. No respiratory distress.  Musculoskeletal:        General: No swelling.     Cervical back: Neck supple.  Skin:    General: Skin is warm and dry.  Neurological:     Mental Status: He is alert.     Cranial Nerves: No cranial nerve deficit.     Sensory: No sensory deficit.     Motor: No weakness.      Comments: Left beating nystagmus, no nystagmus at rest  Psychiatric:        Mood and Affect: Mood normal.     ED Results and Treatments Labs (all labs ordered are listed, but only abnormal results are displayed) Labs Reviewed  COMPREHENSIVE METABOLIC PANEL - Abnormal; Notable for the following components:      Result Value   Potassium 3.4 (*)    CO2 21 (*)    Glucose, Bld 123 (*)    All other components within normal limits  CBC WITH DIFFERENTIAL/PLATELET - Abnormal; Notable for the following components:   RBC 3.93 (*)    HCT 36.9 (*)    MCH 34.4 (*)    MCHC 36.6 (*)    All other components within normal limits                                                                                                                          Radiology No results found.  Pertinent labs & imaging results that were available during my care of the patient were reviewed by me and considered in my medical decision making (see MDM for details).  Medications Ordered in ED Medications  prochlorperazine (COMPAZINE) injection 10 mg (10 mg Intravenous Given 03/02/23 0820)  diphenhydrAMINE (BENADRYL) injection 25 mg (25 mg Intravenous Given 03/02/23 0819)  lactated ringers bolus 1,000 mL (1,000 mLs Intravenous New Bag/Given 03/02/23 0818)  Procedures Procedures  (including critical care time)  Medical Decision Making / ED Course   This patient presents to the ED for concern of vertigo, this involves an extensive number of treatment options, and is a complaint that carries with it a high risk of complications and morbidity.  The differential diagnosis includes BPPV, CVA, electrolyte abnormality, labyrinthitis/vestibular neuritis, Meniere's disease  MDM: Patient seen emergency room for evaluation of vertigo.  Physical exam with inducible nystagmus with left-sided gaze, no  nystagmus at rest, neurologic exam otherwise unremarkable with no focal motor or sensory deficits.  No cranial nerve deficits.  Gait normal.  Laboratory evaluation with mild hypokalemia to 3.4 but is otherwise unremarkable.  CT angio head and neck with no evidence of infarct or occlusion, mild atherosclerotic disease.  Patient given a headache cocktail and on reevaluation his vertigo has resolved.  As patient has had no neurologic deficits with inducible vertigo that was fatigable and has since resolved, lower suspicion for CVA and patient presentation consistent with BPPV.  Given the patient has had known chronic issues with his left ear and associated tinnitus, this also hints at more of a peripheral diagnosis with pathology in the inner ear.  At this time he does not meet inpatient criteria for admission and he was discharged with meclizine and instructions to perform Epley maneuvers at home if symptoms were return.  He was given return precautions of which he voiced understanding he was discharged.   Additional history obtained: -Additional history obtained from multiple family members -External records from outside source obtained and reviewed including: Chart review including previous notes, labs, imaging, consultation notes   Lab Tests: -I ordered, reviewed, and interpreted labs.   The pertinent results include:   Labs Reviewed  COMPREHENSIVE METABOLIC PANEL - Abnormal; Notable for the following components:      Result Value   Potassium 3.4 (*)    CO2 21 (*)    Glucose, Bld 123 (*)    All other components within normal limits  CBC WITH DIFFERENTIAL/PLATELET - Abnormal; Notable for the following components:   RBC 3.93 (*)    HCT 36.9 (*)    MCH 34.4 (*)    MCHC 36.6 (*)    All other components within normal limits      EKG   EKG Interpretation Date/Time:  Thursday March 02 2023 07:42:04 EDT Ventricular Rate:  54 PR Interval:  170 QRS Duration:  108 QT Interval:  491 QTC  Calculation: 466 R Axis:   98  Text Interpretation: Right and left arm electrode reversal, interpretation assumes no reversal Sinus rhythm Right axis deviation Confirmed by Vickie Ponds ([id number]) on 03/02/2023 8:08:28 AM         Imaging Studies ordered: I ordered imaging studies including CT angio head and neck I independently visualized and interpreted imaging. I agree with the radiologist interpretation   Medicines ordered and prescription drug management: Meds ordered this encounter  Medications   prochlorperazine (COMPAZINE) injection 10 mg   diphenhydrAMINE (BENADRYL) injection 25 mg   lactated ringers bolus 1,000 mL    -I have reviewed the patients home medicines and have made adjustments as needed  Critical interventions none    Cardiac Monitoring: The patient was maintained on a cardiac monitor.  I personally viewed and interpreted the cardiac monitored which showed an underlying rhythm of: NSR  Social Determinants of Health:  Factors impacting patients care include: none   Reevaluation: After the interventions noted above, I reevaluated the patient and found  that they have :improved  Co morbidities that complicate the patient evaluation  Past Medical History:  Diagnosis Date   Anxiety    Atypical mole 02/16/2006   right mild abdomen   Basal cell carcinoma 02/16/2006   left chest (curet)   Essential hypertension 08/29/2018   GERD (gastroesophageal reflux disease)    PAC (premature atrial contraction) 08/28/2018   Palpitations 08/28/2018   SVT (supraventricular tachycardia)    Vitamin D deficiency       Dispostion: I considered admission for this patient, but at this time he does not meet inpatient criteria for admission and he is safe for discharge with outpatient follow-up     Final Clinical Impression(s) / ED Diagnoses Final diagnoses:  None     @PCDICTATION @    Glendora Score, MD 03/02/23 1714

## 2023-03-02 NOTE — ED Notes (Signed)
Pt stated that he needed to urinate. Informed pt that someone would need to be in here to monitor pt due to chief complaint of dizziness. Pt refused to use the bathroom sitting down. Informed pt again that if he wanted to stand up either pts spouse or staff would need to be in there to assist. Pt refused again. Pt RN informed.

## 2023-03-16 DIAGNOSIS — Z23 Encounter for immunization: Secondary | ICD-10-CM | POA: Diagnosis not present

## 2023-03-19 IMAGING — MR MR THORACIC SPINE W/O CM
4 of 6 series · 18 of 48 positions shown · non-contrast
Comparison: Thoracic spine radiographs 02/04/2021

CLINICAL DATA: Back pain radiating to the right side for years

EXAM:
MRI THORACIC SPINE WITHOUT CONTRAST
TECHNIQUE: Multiplanar, multisequence MR imaging of the thoracic spine was
performed. No intravenous contrast was administered.

[Series 2: T1 · sagittal · 3.0mm · 1.06mm/px · 3 of 11 slices shown]
[im 1/11]
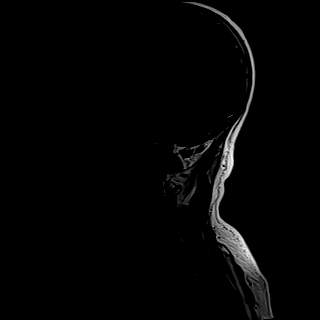
[im 7/11]
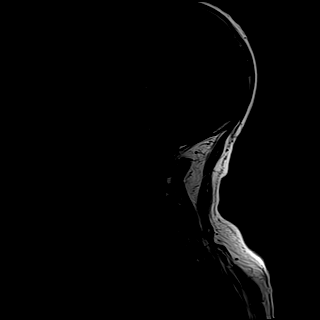
[im 11/11]
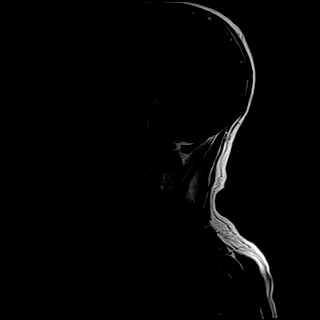

[Series 4: T2 · sagittal · 4.0mm · 0.50mm/px · 6 of 18 slices shown (1 of 3)]
[im 1/18]
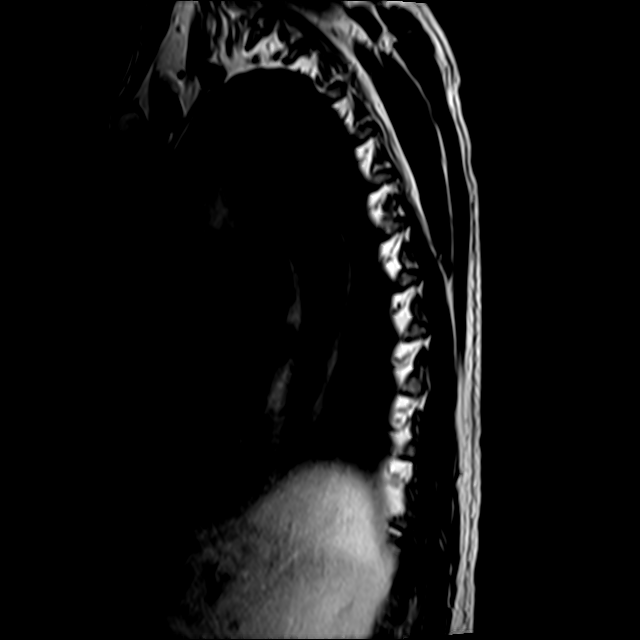
[im 4/18]
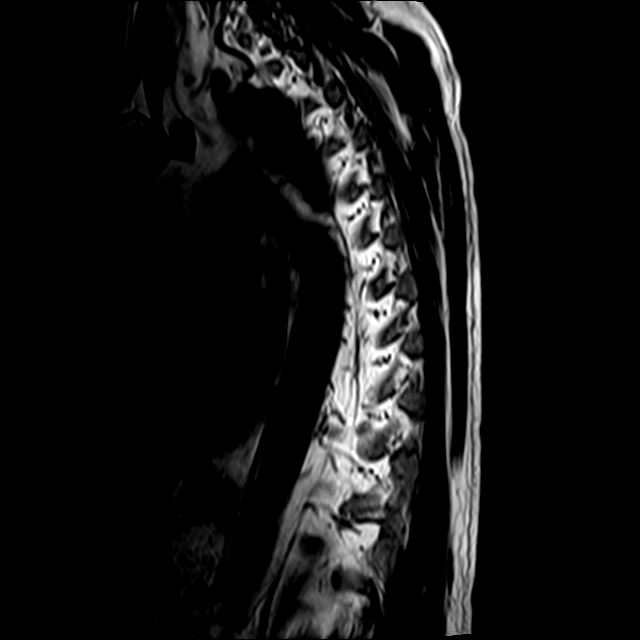
[im 7/18]
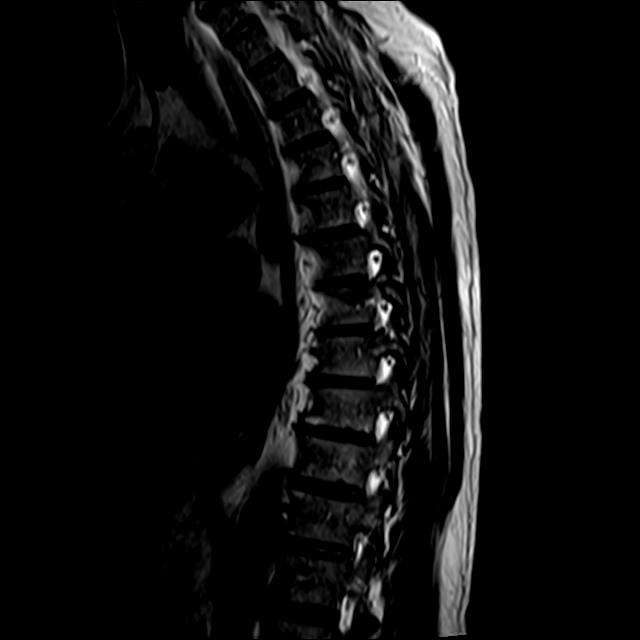
[im 11/18]
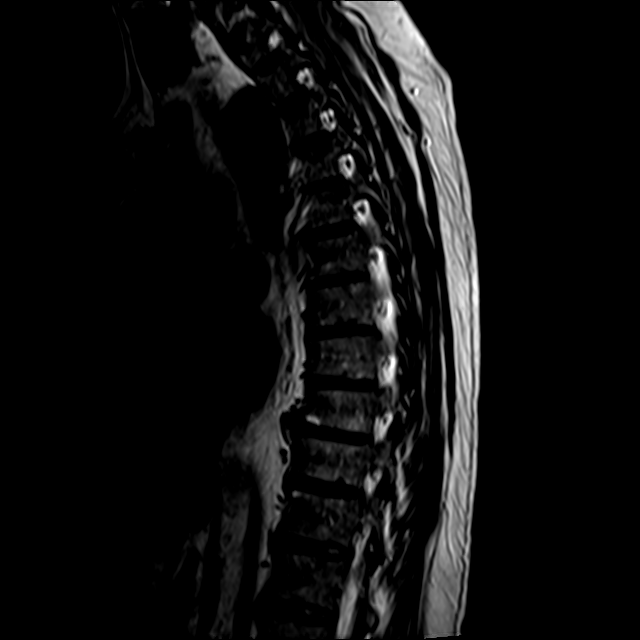
[im 14/18]
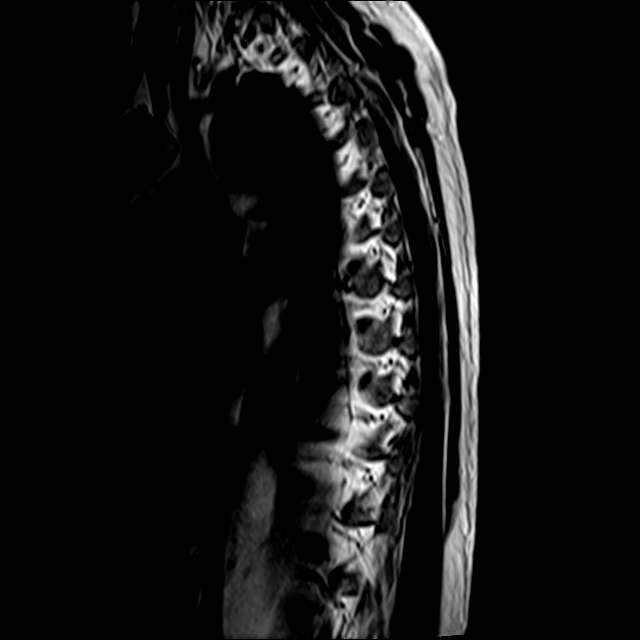
[im 18/18]
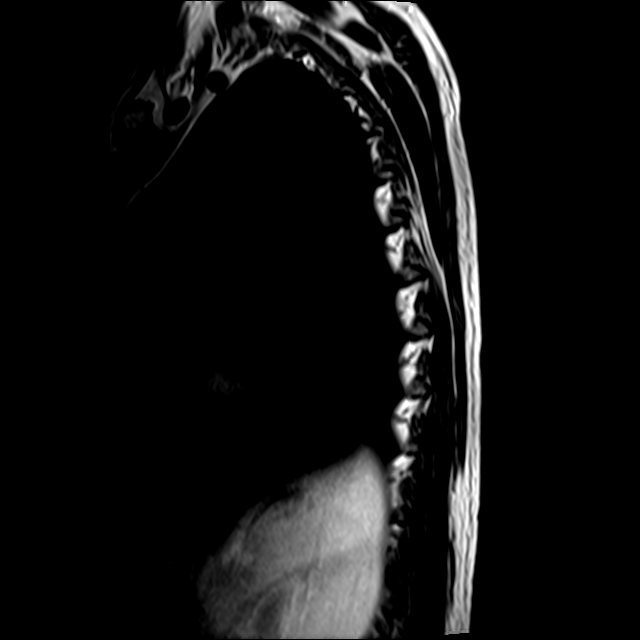

[Series 7: T2 · axial · 4.0mm · 0.39mm/px · z∈[-341,-117]mm · 6 of 39 slices shown (2 of 3)]
[im 1/39]
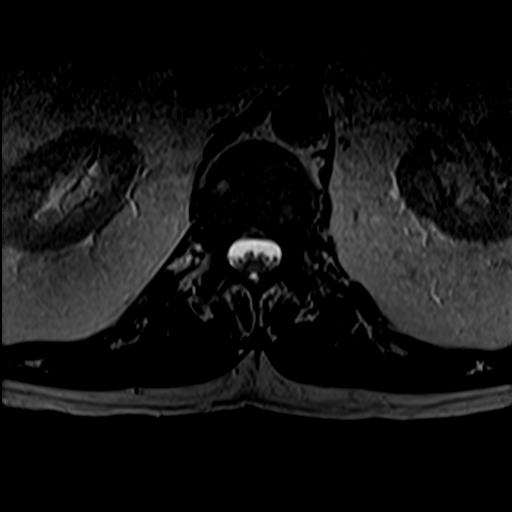
[im 7/39]
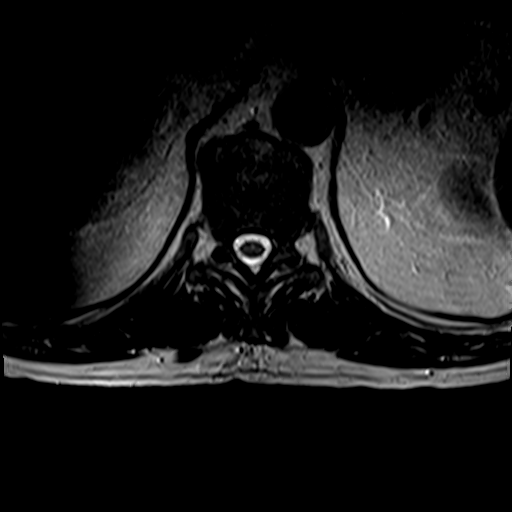
[im 13/39]
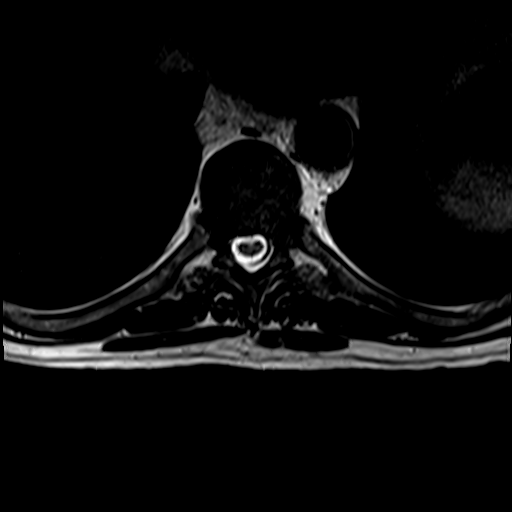
[im 16/39]
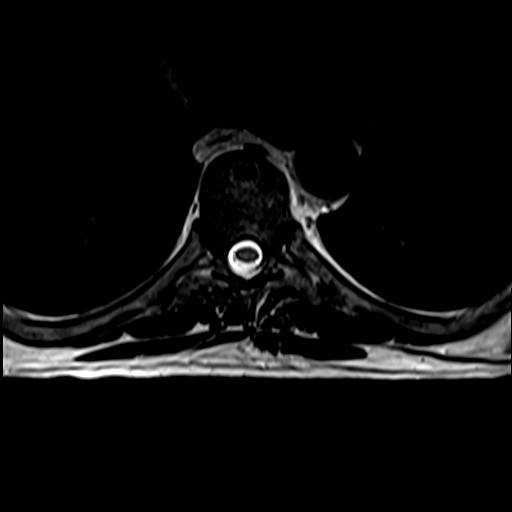
[im 20/39]
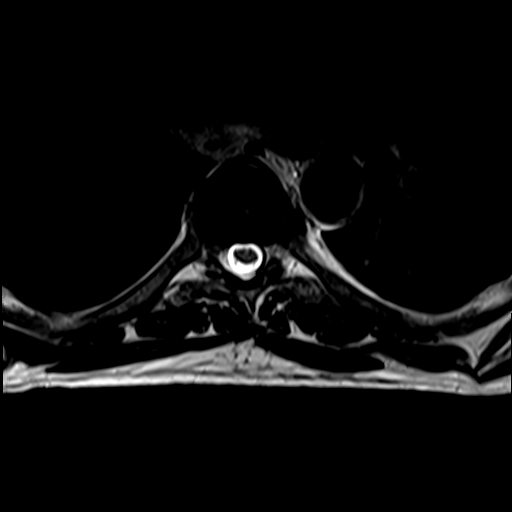
[im 32/39]
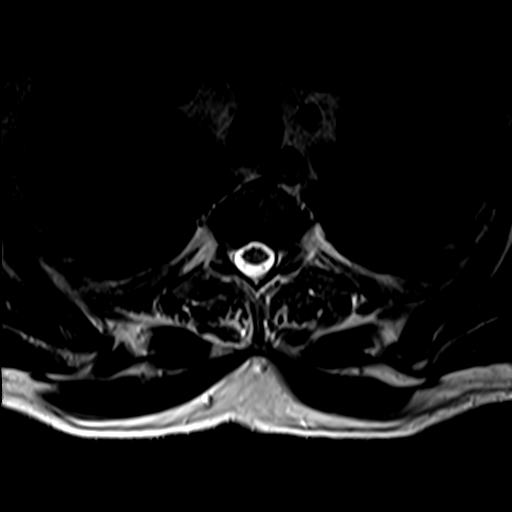

[Series 8: T2 · axial · 4.0mm · 0.39mm/px · z∈[-277,-117]mm · 3 of 39 slices shown (3 of 3)]
[im 7/39]
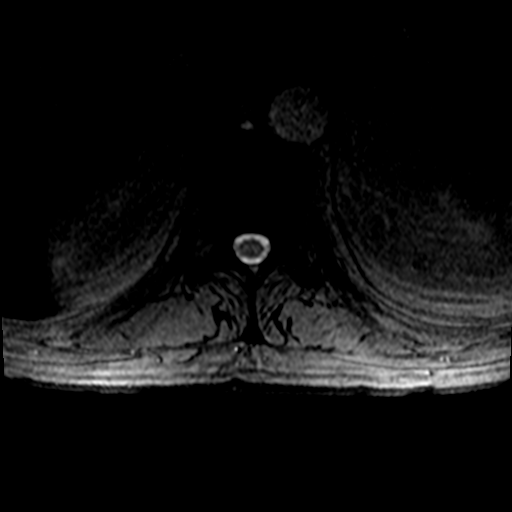
[im 20/39]
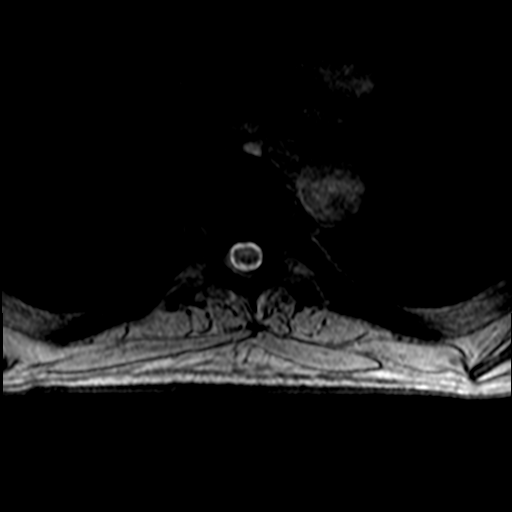
[im 32/39]
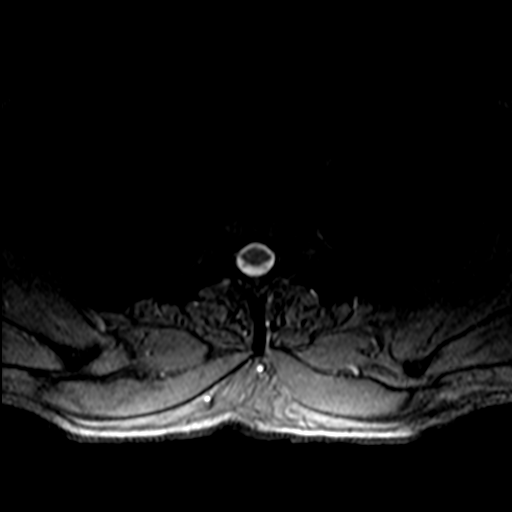

[18 of 48 positions shown; findings below may reference images not displayed]

FINDINGS: Alignment:  Physiologic.

Vertebrae: There is minimal degenerative endplate signal abnormality
along the inferior T10 endplate and superior T11 endplate. Marrow
signal is otherwise somewhat heterogeneous throughout, without focal
or suspicious signal abnormality. Vertebral body heights are
preserved, without evidence of acute fracture.

Cord:  Normal signal and morphology.

Paraspinal and other soft tissues: Negative.

Disc levels:

There is mild multilevel disc desiccation and narrowing throughout
the thoracic spine. There is associated mild degenerative endplate
change and facet arthropathy. There are small disc protrusions at
T5-T6, T6-T7, and T8-T9, most pronounced at T8-T9 where there is
mild spinal canal stenosis with mild mass effect on the thoracic
cord. There is no significant neural foraminal stenosis in the
thoracic spine.
IMPRESSION: 1. Small disc protrusion at T8-T9 resulting in mild spinal canal
stenosis with mass effect on the cord.
2. Otherwise, mild degenerative changes throughout the thoracic
spine as above without other significant spinal canal or neural
foraminal stenosis.

## 2023-03-27 ENCOUNTER — Telehealth: Payer: Self-pay

## 2023-03-27 NOTE — Telephone Encounter (Signed)
Transition Care Management Unsuccessful Follow-up Telephone Call  Date of discharge and from where:  03/02/2023 [facility]  Attempts:  1st Attempt  Reason for unsuccessful TCM follow-up call:  No answer/busy  Aamir Mclinden [facility]  [facility] [place] [facility] Resource Care Guide Direct Dial: [phone]  Website: [email or web address]

## 2023-03-28 ENCOUNTER — Telehealth: Payer: Self-pay

## 2023-03-28 NOTE — Telephone Encounter (Signed)
Transition Care Management Unsuccessful Follow-up Telephone Call  Date of discharge and from where:  03/02/2023 [facility]  Attempts:  2nd Attempt  Reason for unsuccessful TCM follow-up call:  No answer/busy  Jason Rubio [facility]  [facility], [address] [facility] Resource Care Guide Direct Dial: [phone]  Website: [email or web address]

## 2023-09-19 ENCOUNTER — Other Ambulatory Visit: Payer: Self-pay | Admitting: Registered Nurse

## 2023-09-19 DIAGNOSIS — R911 Solitary pulmonary nodule: Secondary | ICD-10-CM

## 2023-09-25 DIAGNOSIS — E038 Other specified hypothyroidism: Secondary | ICD-10-CM | POA: Diagnosis not present

## 2023-09-25 DIAGNOSIS — E559 Vitamin D deficiency, unspecified: Secondary | ICD-10-CM | POA: Diagnosis not present

## 2023-09-25 DIAGNOSIS — I471 Supraventricular tachycardia, unspecified: Secondary | ICD-10-CM | POA: Diagnosis not present

## 2023-09-25 DIAGNOSIS — I10 Essential (primary) hypertension: Secondary | ICD-10-CM | POA: Diagnosis not present

## 2023-09-26 DIAGNOSIS — E559 Vitamin D deficiency, unspecified: Secondary | ICD-10-CM | POA: Diagnosis not present

## 2023-09-26 DIAGNOSIS — E038 Other specified hypothyroidism: Secondary | ICD-10-CM | POA: Diagnosis not present

## 2023-09-26 DIAGNOSIS — I10 Essential (primary) hypertension: Secondary | ICD-10-CM | POA: Diagnosis not present

## 2023-10-04 DIAGNOSIS — I10 Essential (primary) hypertension: Secondary | ICD-10-CM | POA: Diagnosis not present

## 2023-10-04 DIAGNOSIS — E559 Vitamin D deficiency, unspecified: Secondary | ICD-10-CM | POA: Diagnosis not present

## 2023-10-04 DIAGNOSIS — Z0000 Encounter for general adult medical examination without abnormal findings: Secondary | ICD-10-CM | POA: Diagnosis not present

## 2023-10-05 ENCOUNTER — Ambulatory Visit: Payer: Medicare Other | Admitting: Dermatology

## 2023-10-17 ENCOUNTER — Ambulatory Visit: Admitting: Dermatology

## 2023-10-17 ENCOUNTER — Encounter: Payer: Self-pay | Admitting: Dermatology

## 2023-10-17 DIAGNOSIS — L578 Other skin changes due to chronic exposure to nonionizing radiation: Secondary | ICD-10-CM

## 2023-10-17 DIAGNOSIS — L570 Actinic keratosis: Secondary | ICD-10-CM

## 2023-10-17 DIAGNOSIS — L814 Other melanin hyperpigmentation: Secondary | ICD-10-CM | POA: Diagnosis not present

## 2023-10-17 DIAGNOSIS — D1801 Hemangioma of skin and subcutaneous tissue: Secondary | ICD-10-CM | POA: Diagnosis not present

## 2023-10-17 DIAGNOSIS — S1081XA Abrasion of other specified part of neck, initial encounter: Secondary | ICD-10-CM

## 2023-10-17 DIAGNOSIS — Z7189 Other specified counseling: Secondary | ICD-10-CM

## 2023-10-17 DIAGNOSIS — L820 Inflamed seborrheic keratosis: Secondary | ICD-10-CM | POA: Diagnosis not present

## 2023-10-17 DIAGNOSIS — S0001XA Abrasion of scalp, initial encounter: Secondary | ICD-10-CM | POA: Diagnosis not present

## 2023-10-17 DIAGNOSIS — Z79899 Other long term (current) drug therapy: Secondary | ICD-10-CM

## 2023-10-17 DIAGNOSIS — Z85828 Personal history of other malignant neoplasm of skin: Secondary | ICD-10-CM

## 2023-10-17 DIAGNOSIS — Z86018 Personal history of other benign neoplasm: Secondary | ICD-10-CM | POA: Diagnosis not present

## 2023-10-17 DIAGNOSIS — D229 Melanocytic nevi, unspecified: Secondary | ICD-10-CM

## 2023-10-17 DIAGNOSIS — L821 Other seborrheic keratosis: Secondary | ICD-10-CM | POA: Diagnosis not present

## 2023-10-17 DIAGNOSIS — W908XXA Exposure to other nonionizing radiation, initial encounter: Secondary | ICD-10-CM

## 2023-10-17 DIAGNOSIS — Z1283 Encounter for screening for malignant neoplasm of skin: Secondary | ICD-10-CM | POA: Diagnosis not present

## 2023-10-17 DIAGNOSIS — T07XXXA Unspecified multiple injuries, initial encounter: Secondary | ICD-10-CM

## 2023-10-17 MED ORDER — MUPIROCIN 2 % EX OINT: 1.0000 | TOPICAL_OINTMENT | Freq: Every day | CUTANEOUS | 1 refills | Status: AC

## 2023-10-17 NOTE — Patient Instructions (Addendum)

## 2023-10-17 NOTE — Progress Notes (Signed)
 Follow-Up Visit   Subjective  Jason Rubio is a 63 y.o. male who presents for the following: Skin Cancer Screening and Full Body Skin Exam hx of BCC, Dysplastic Nevus, Aks, check spots back irritating, L wrist irritating,   The patient presents for Total-Body Skin Exam (TBSE) for skin cancer screening and mole check. The patient has spots, moles and lesions to be evaluated, some may be new or changing and the patient may have concern these could be cancer.  The following portions of the chart were reviewed this encounter and updated as appropriate: medications, allergies, medical history  Review of Systems:  No other skin or systemic complaints except as noted in HPI or Assessment and Plan.  Objective  Well appearing patient in no apparent distress; mood and affect are within normal limits.  A full examination was performed including scalp, head, eyes, ears, nose, lips, neck, chest, axillae, abdomen, back, buttocks, bilateral upper extremities, bilateral lower extremities, hands, feet, fingers, toes, fingernails, and toenails. All findings within normal limits unless otherwise noted below.   Relevant physical exam findings are noted in the Assessment and Plan.  L dorsum lat wrist x 1 Stuck on waxy paps with erythema R ant scalp x 1 Pink scaly macules  Assessment & Plan   SKIN CANCER SCREENING PERFORMED TODAY.  ACTINIC DAMAGE - Chronic condition, secondary to cumulative UV/sun exposure - diffuse scaly erythematous macules with underlying dyspigmentation - Recommend daily broad spectrum sunscreen SPF 30+ to sun-exposed areas, reapply every 2 hours as needed.  - Staying in the shade or wearing long sleeves, sun glasses (UVA+UVB protection) and wide brim hats (4-inch brim around the entire circumference of the hat) are also recommended for sun protection.  - Call for new or changing lesions.  LENTIGINES, SEBORRHEIC KERATOSES, HEMANGIOMAS - Benign normal skin lesions -  Benign-appearing - Call for any changes  MELANOCYTIC NEVI - Tan-brown and/or pink-flesh-colored symmetric macules and papules - Benign appearing on exam today - Observation - Call clinic for new or changing moles - Recommend daily use of broad spectrum spf 30+ sunscreen to sun-exposed areas.   HISTORY OF BASAL CELL CARCINOMA OF THE SKIN - No evidence of recurrence today - Recommend regular full body skin exams - Recommend daily broad spectrum sunscreen SPF 30+ to sun-exposed areas, reapply every 2 hours as needed.  - Call if any new or changing lesions are noted between office visits  - L chest  HISTORY OF DYSPLASTIC NEVUS No evidence of recurrence today Recommend regular full body skin exams Recommend daily broad spectrum sunscreen SPF 30+ to sun-exposed areas, reapply every 2 hours as needed.  Call if any new or changing lesions are noted between office visits  - R mid abdomen  EXCORIATIONS Exam: crusted pap R post neck crust scalp Treatment Plan: Start Mupirocin  oint qd, if R post neck does not resolve in 2 months RTC for biopsy  INFLAMED SEBORRHEIC KERATOSIS L dorsum lat wrist x 1 Symptomatic, irritating, patient would like treated. Destruction of lesion - L dorsum lat wrist x 1 Complexity: simple   Destruction method: cryotherapy   Informed consent: discussed and consent obtained   Timeout:  patient name, date of birth, surgical site, and procedure verified Lesion destroyed using liquid nitrogen: Yes   Region frozen until ice ball extended beyond lesion: Yes   Outcome: patient tolerated procedure well with no complications   Post-procedure details: wound care instructions given   AK (ACTINIC KERATOSIS) R ant scalp x 1 Hx of  topical 5FU use in past from Dr. Steen Eden  Actinic keratoses are precancerous spots that appear secondary to cumulative UV radiation exposure/sun exposure over time. They are chronic with expected duration over 1 year. A portion of actinic keratoses  will progress to squamous cell carcinoma of the skin. It is not possible to reliably predict which spots will progress to skin cancer and so treatment is recommended to prevent development of skin cancer.  Recommend daily broad spectrum sunscreen SPF 30+ to sun-exposed areas, reapply every 2 hours as needed.  Recommend staying in the shade or wearing long sleeves, sun glasses (UVA+UVB protection) and wide brim hats (4-inch brim around the entire circumference of the hat). Call for new or changing lesions. Destruction of lesion - R ant scalp x 1 Complexity: simple   Destruction method: cryotherapy   Informed consent: discussed and consent obtained   Timeout:  patient name, date of birth, surgical site, and procedure verified Lesion destroyed using liquid nitrogen: Yes   Region frozen until ice ball extended beyond lesion: Yes   Outcome: patient tolerated procedure well with no complications   Post-procedure details: wound care instructions given   Return in about 1 year (around 10/16/2024) for TBSE, Hx of BCC, Hx of Dysplastic nevi, Hx of AKs.  I, Rollie Clipper, RMA, am acting as scribe for Celine Collard, MD .   Documentation: I have reviewed the above documentation for accuracy and completeness, and I agree with the above.  Celine Collard, MD

## 2023-11-27 ENCOUNTER — Other Ambulatory Visit

## 2024-01-09 DIAGNOSIS — R350 Frequency of micturition: Secondary | ICD-10-CM | POA: Diagnosis not present

## 2024-03-26 DIAGNOSIS — K640 First degree hemorrhoids: Secondary | ICD-10-CM | POA: Diagnosis not present

## 2024-03-26 DIAGNOSIS — Z860101 Personal history of adenomatous and serrated colon polyps: Secondary | ICD-10-CM | POA: Diagnosis not present

## 2024-03-26 DIAGNOSIS — K5730 Diverticulosis of large intestine without perforation or abscess without bleeding: Secondary | ICD-10-CM | POA: Diagnosis not present

## 2024-03-26 DIAGNOSIS — Z09 Encounter for follow-up examination after completed treatment for conditions other than malignant neoplasm: Secondary | ICD-10-CM | POA: Diagnosis not present

## 2024-03-26 DIAGNOSIS — D124 Benign neoplasm of descending colon: Secondary | ICD-10-CM | POA: Diagnosis not present

## 2024-04-15 DIAGNOSIS — R1012 Left upper quadrant pain: Secondary | ICD-10-CM | POA: Diagnosis not present

## 2024-10-22 ENCOUNTER — Ambulatory Visit: Admitting: Dermatology
